# Patient Record
Sex: Female | Born: 1990 | Race: White | Hispanic: No | Smoking: Current every day smoker
Health system: Southern US, Community
[De-identification: ages and names within clinical notes are randomized; demographics above are authoritative.]

## PROBLEM LIST (undated history)

## (undated) ENCOUNTER — Inpatient Hospital Stay: Payer: Self-pay

## (undated) ENCOUNTER — Inpatient Hospital Stay (HOSPITAL_COMMUNITY): Payer: Self-pay

## (undated) DIAGNOSIS — L509 Urticaria, unspecified: Secondary | ICD-10-CM

## (undated) DIAGNOSIS — F329 Major depressive disorder, single episode, unspecified: Secondary | ICD-10-CM

## (undated) DIAGNOSIS — G4700 Insomnia, unspecified: Secondary | ICD-10-CM

## (undated) DIAGNOSIS — F32A Depression, unspecified: Secondary | ICD-10-CM

## (undated) DIAGNOSIS — F988 Other specified behavioral and emotional disorders with onset usually occurring in childhood and adolescence: Secondary | ICD-10-CM

## (undated) DIAGNOSIS — F1910 Other psychoactive substance abuse, uncomplicated: Secondary | ICD-10-CM

## (undated) DIAGNOSIS — T783XXA Angioneurotic edema, initial encounter: Secondary | ICD-10-CM

## (undated) DIAGNOSIS — N814 Uterovaginal prolapse, unspecified: Secondary | ICD-10-CM

## (undated) DIAGNOSIS — A549 Gonococcal infection, unspecified: Secondary | ICD-10-CM

## (undated) DIAGNOSIS — A749 Chlamydial infection, unspecified: Secondary | ICD-10-CM

## (undated) DIAGNOSIS — F419 Anxiety disorder, unspecified: Secondary | ICD-10-CM

## (undated) HISTORY — DX: Urticaria, unspecified: L50.9

## (undated) HISTORY — DX: Angioneurotic edema, initial encounter: T78.3XXA

## (undated) HISTORY — PX: WISDOM TOOTH EXTRACTION: SHX21

---
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
## 2009-02-03 ENCOUNTER — Inpatient Hospital Stay (HOSPITAL_COMMUNITY): Admission: AD | Admit: 2009-02-03 | Discharge: 2009-02-03 | Payer: Self-pay | Admitting: Obstetrics and Gynecology

## 2009-03-01 ENCOUNTER — Ambulatory Visit (HOSPITAL_COMMUNITY): Admission: RE | Admit: 2009-03-01 | Discharge: 2009-03-01 | Payer: Self-pay | Admitting: Obstetrics and Gynecology

## 2009-03-07 ENCOUNTER — Ambulatory Visit (HOSPITAL_COMMUNITY): Admission: RE | Admit: 2009-03-07 | Discharge: 2009-03-07 | Payer: Self-pay | Admitting: Obstetrics and Gynecology

## 2009-03-22 ENCOUNTER — Ambulatory Visit (HOSPITAL_COMMUNITY): Admission: RE | Admit: 2009-03-22 | Discharge: 2009-03-22 | Payer: Self-pay | Admitting: Obstetrics and Gynecology

## 2009-03-28 ENCOUNTER — Inpatient Hospital Stay (HOSPITAL_COMMUNITY): Admission: AD | Admit: 2009-03-28 | Discharge: 2009-03-28 | Payer: Self-pay | Admitting: Obstetrics & Gynecology

## 2009-04-02 ENCOUNTER — Emergency Department: Payer: Self-pay | Admitting: Unknown Physician Specialty

## 2009-04-19 ENCOUNTER — Ambulatory Visit (HOSPITAL_COMMUNITY): Admission: RE | Admit: 2009-04-19 | Discharge: 2009-04-19 | Payer: Self-pay | Admitting: Obstetrics and Gynecology

## 2009-06-14 ENCOUNTER — Ambulatory Visit (HOSPITAL_COMMUNITY): Admission: RE | Admit: 2009-06-14 | Discharge: 2009-06-14 | Payer: Self-pay | Admitting: Obstetrics and Gynecology

## 2009-08-13 ENCOUNTER — Inpatient Hospital Stay (HOSPITAL_COMMUNITY): Admission: AD | Admit: 2009-08-13 | Discharge: 2009-08-16 | Payer: Self-pay | Admitting: Obstetrics & Gynecology

## 2009-08-18 ENCOUNTER — Inpatient Hospital Stay (HOSPITAL_COMMUNITY): Admission: AD | Admit: 2009-08-18 | Discharge: 2009-08-18 | Payer: Self-pay | Admitting: Obstetrics & Gynecology

## 2010-01-24 ENCOUNTER — Emergency Department: Payer: Self-pay | Admitting: Emergency Medicine

## 2010-01-28 ENCOUNTER — Emergency Department: Payer: Self-pay | Admitting: Emergency Medicine

## 2010-05-24 ENCOUNTER — Emergency Department: Payer: Self-pay | Admitting: Emergency Medicine

## 2010-06-05 ENCOUNTER — Ambulatory Visit: Payer: Self-pay | Admitting: Nurse Practitioner

## 2010-06-05 ENCOUNTER — Inpatient Hospital Stay (HOSPITAL_COMMUNITY): Admission: AD | Admit: 2010-06-05 | Discharge: 2010-06-05 | Payer: Self-pay | Admitting: Obstetrics and Gynecology

## 2010-08-09 ENCOUNTER — Inpatient Hospital Stay (HOSPITAL_COMMUNITY)
Admission: AD | Admit: 2010-08-09 | Discharge: 2010-08-09 | Payer: Self-pay | Source: Home / Self Care | Attending: Family Medicine | Admitting: Family Medicine

## 2010-08-19 LAB — DIFFERENTIAL
Basophils Absolute: 0 10*3/uL (ref 0.0–0.1)
Basophils Relative: 0 % (ref 0–1)
Eosinophils Absolute: 0.2 10*3/uL (ref 0.0–0.7)
Eosinophils Relative: 3 % (ref 0–5)
Lymphocytes Relative: 22 % (ref 12–46)
Lymphs Abs: 1.3 10*3/uL (ref 0.7–4.0)
Monocytes Absolute: 0.5 10*3/uL (ref 0.1–1.0)
Monocytes Relative: 7 % (ref 3–12)
Neutro Abs: 4.2 10*3/uL (ref 1.7–7.7)
Neutrophils Relative %: 68 % (ref 43–77)

## 2010-08-19 LAB — URINALYSIS, ROUTINE W REFLEX MICROSCOPIC
Bilirubin Urine: NEGATIVE
Hgb urine dipstick: NEGATIVE
Ketones, ur: NEGATIVE mg/dL
Nitrite: NEGATIVE
Protein, ur: NEGATIVE mg/dL
Specific Gravity, Urine: 1.02 (ref 1.005–1.030)
Urine Glucose, Fasting: NEGATIVE mg/dL
Urobilinogen, UA: 0.2 mg/dL (ref 0.0–1.0)
pH: 6 (ref 5.0–8.0)

## 2010-08-19 LAB — CBC
HCT: 33.1 % — ABNORMAL LOW (ref 36.0–46.0)
Hemoglobin: 11.4 g/dL — ABNORMAL LOW (ref 12.0–15.0)
MCH: 31.8 pg (ref 26.0–34.0)
MCHC: 34.4 g/dL (ref 30.0–36.0)
MCV: 92.2 fL (ref 78.0–100.0)
Platelets: 169 10*3/uL (ref 150–400)
RBC: 3.59 MIL/uL — ABNORMAL LOW (ref 3.87–5.11)
RDW: 12.9 % (ref 11.5–15.5)
WBC: 6.2 10*3/uL (ref 4.0–10.5)

## 2010-08-19 LAB — WET PREP, GENITAL
Clue Cells Wet Prep HPF POC: NONE SEEN
Trich, Wet Prep: NONE SEEN
Yeast Wet Prep HPF POC: NONE SEEN

## 2010-08-19 LAB — GC/CHLAMYDIA PROBE AMP, GENITAL
Chlamydia, DNA Probe: NEGATIVE
GC Probe Amp, Genital: NEGATIVE

## 2010-09-23 ENCOUNTER — Inpatient Hospital Stay (HOSPITAL_COMMUNITY)
Admission: AD | Admit: 2010-09-23 | Discharge: 2010-09-23 | Disposition: A | Discharge status: Home / Self Care | Payer: Medicaid Other | Source: Ambulatory Visit | Attending: Obstetrics & Gynecology | Admitting: Obstetrics & Gynecology

## 2010-09-23 ENCOUNTER — Encounter (HOSPITAL_COMMUNITY): Payer: Self-pay | Admitting: Radiology

## 2010-09-23 ENCOUNTER — Inpatient Hospital Stay (HOSPITAL_COMMUNITY): Payer: Medicaid Other

## 2010-09-23 DIAGNOSIS — N390 Urinary tract infection, site not specified: Secondary | ICD-10-CM | POA: Insufficient documentation

## 2010-09-23 DIAGNOSIS — O209 Hemorrhage in early pregnancy, unspecified: Secondary | ICD-10-CM | POA: Insufficient documentation

## 2010-09-23 DIAGNOSIS — O2390 Unspecified genitourinary tract infection in pregnancy, unspecified trimester: Secondary | ICD-10-CM | POA: Insufficient documentation

## 2010-09-23 LAB — URINE MICROSCOPIC-ADD ON

## 2010-09-23 LAB — URINALYSIS, ROUTINE W REFLEX MICROSCOPIC
Bilirubin Urine: NEGATIVE
Ketones, ur: NEGATIVE mg/dL
Nitrite: NEGATIVE
Protein, ur: NEGATIVE mg/dL
Specific Gravity, Urine: >1.03 — ABNORMAL HIGH (ref 1.005–1.030)
Urine Glucose, Fasting: NEGATIVE mg/dL
Urobilinogen, UA: 0.2 mg/dL (ref 0.0–1.0)
pH: 6 (ref 5.0–8.0)

## 2010-09-23 LAB — WET PREP, GENITAL
Clue Cells Wet Prep HPF POC: NONE SEEN
Trich, Wet Prep: NONE SEEN
Yeast Wet Prep HPF POC: NONE SEEN

## 2010-09-24 LAB — GC/CHLAMYDIA PROBE AMP, GENITAL
Chlamydia, DNA Probe: NEGATIVE
GC Probe Amp, Genital: NEGATIVE

## 2010-09-25 LAB — HERPES SIMPLEX VIRUS CULTURE: Culture: NOT DETECTED

## 2010-09-25 LAB — URINE CULTURE
Colony Count: 25000
Culture  Setup Time: 201202210151

## 2010-10-15 LAB — URINALYSIS, ROUTINE W REFLEX MICROSCOPIC
Bilirubin Urine: NEGATIVE
Glucose, UA: NEGATIVE mg/dL
Ketones, ur: NEGATIVE mg/dL
Nitrite: NEGATIVE
Protein, ur: NEGATIVE mg/dL
Specific Gravity, Urine: 1.02 (ref 1.005–1.030)
Urobilinogen, UA: 0.2 mg/dL (ref 0.0–1.0)
pH: 7 (ref 5.0–8.0)

## 2010-10-15 LAB — URINE MICROSCOPIC-ADD ON

## 2010-10-15 LAB — POCT PREGNANCY, URINE: Preg Test, Ur: POSITIVE

## 2010-10-15 LAB — CBC
HCT: 37.8 % (ref 36.0–46.0)
Hemoglobin: 12.9 g/dL (ref 12.0–15.0)
MCH: 32 pg (ref 26.0–34.0)
MCV: 93.7 fL (ref 78.0–100.0)
Platelets: 203 10*3/uL (ref 150–400)
RBC: 4.04 MIL/uL (ref 3.87–5.11)
WBC: 6.8 10*3/uL (ref 4.0–10.5)

## 2010-10-15 LAB — WET PREP, GENITAL: Trich, Wet Prep: NONE SEEN

## 2010-10-15 LAB — GC/CHLAMYDIA PROBE AMP, GENITAL: GC Probe Amp, Genital: NEGATIVE

## 2010-10-20 LAB — RH IMMUNE GLOB WKUP(>/=20WKS)(NOT WOMEN'S HOSP)

## 2010-10-20 LAB — CBC
HCT: 21.6 % — ABNORMAL LOW (ref 36.0–46.0)
MCHC: 33.5 g/dL (ref 30.0–36.0)
Platelets: 141 10*3/uL — ABNORMAL LOW (ref 150–400)
RBC: 2.27 MIL/uL — ABNORMAL LOW (ref 3.87–5.11)
RBC: 3.44 MIL/uL — ABNORMAL LOW (ref 3.87–5.11)
WBC: 9.3 10*3/uL (ref 4.0–10.5)
WBC: 9.8 10*3/uL (ref 4.0–10.5)

## 2010-10-20 LAB — RPR: RPR Ser Ql: NONREACTIVE

## 2010-10-20 LAB — URINALYSIS, ROUTINE W REFLEX MICROSCOPIC
Glucose, UA: NEGATIVE mg/dL
Ketones, ur: NEGATIVE mg/dL
Protein, ur: 30 mg/dL — AB

## 2010-10-20 LAB — URINE MICROSCOPIC-ADD ON

## 2010-11-09 ENCOUNTER — Inpatient Hospital Stay (HOSPITAL_COMMUNITY)
Admission: AD | Admit: 2010-11-09 | Discharge: 2010-11-09 | Disposition: A | Discharge status: Home / Self Care | Payer: 59 | Source: Ambulatory Visit | Attending: Obstetrics & Gynecology | Admitting: Obstetrics & Gynecology

## 2010-11-09 ENCOUNTER — Inpatient Hospital Stay (HOSPITAL_COMMUNITY): Payer: 59

## 2010-11-09 DIAGNOSIS — O99891 Other specified diseases and conditions complicating pregnancy: Secondary | ICD-10-CM | POA: Insufficient documentation

## 2010-11-09 DIAGNOSIS — O9989 Other specified diseases and conditions complicating pregnancy, childbirth and the puerperium: Secondary | ICD-10-CM

## 2010-11-09 DIAGNOSIS — R109 Unspecified abdominal pain: Secondary | ICD-10-CM

## 2010-11-09 LAB — COMPREHENSIVE METABOLIC PANEL
ALT: 12 U/L (ref 0–35)
Alkaline Phosphatase: 49 U/L (ref 39–117)
Alkaline Phosphatase: 72 U/L (ref 39–117)
BUN: 4 mg/dL — ABNORMAL LOW (ref 6–23)
CO2: 25 mEq/L (ref 19–32)
Calcium: 8.7 mg/dL (ref 8.4–10.5)
Chloride: 107 mEq/L (ref 96–112)
Chloride: 105 mEq/L (ref 96–112)
GFR calc non Af Amer: >60 mL/min (ref >60)
Glucose, Bld: 75 mg/dL (ref 70–99)
Glucose, Bld: 88 mg/dL (ref 70–99)
Potassium: 4.6 mEq/L (ref 3.5–5.1)
Sodium: 137 mEq/L (ref 135–145)
Total Bilirubin: 0.4 mg/dL (ref 0.3–1.2)
Total Bilirubin: 0.4 mg/dL (ref 0.3–1.2)

## 2010-11-09 LAB — CBC
HCT: 33.9 % — ABNORMAL LOW (ref 36.0–46.0)
HCT: 32.7 % — ABNORMAL LOW (ref 36.0–46.0)
Hemoglobin: 11.8 g/dL — ABNORMAL LOW (ref 12.0–15.0)
Hemoglobin: 11 g/dL — ABNORMAL LOW (ref 12.0–15.0)
MCHC: 33.6 g/dL (ref 30.0–36.0)
WBC: 10.5 10*3/uL (ref 4.0–10.5)

## 2010-11-09 LAB — URINE MICROSCOPIC-ADD ON

## 2010-11-09 LAB — URINALYSIS, ROUTINE W REFLEX MICROSCOPIC
Protein, ur: NEGATIVE mg/dL
Urobilinogen, UA: 1 mg/dL (ref 0.0–1.0)

## 2010-11-09 LAB — AMYLASE: Amylase: 34 U/L (ref 0–105)

## 2010-11-10 LAB — URINE CULTURE

## 2010-11-11 LAB — URINE CULTURE: Colony Count: 75000

## 2010-11-11 LAB — CBC
HCT: 37.9 % (ref 36.0–46.0)
Hemoglobin: 13.4 g/dL (ref 12.0–15.0)
MCV: 98 fL (ref 78.0–100.0)
Platelets: 181 10*3/uL (ref 150–400)
RDW: 12.4 % (ref 11.5–15.5)

## 2010-11-11 LAB — URINALYSIS, ROUTINE W REFLEX MICROSCOPIC
Bilirubin Urine: NEGATIVE
Glucose, UA: NEGATIVE mg/dL
Hgb urine dipstick: NEGATIVE
Ketones, ur: NEGATIVE mg/dL
Protein, ur: NEGATIVE mg/dL
Urobilinogen, UA: 0.2 mg/dL (ref 0.0–1.0)

## 2010-11-11 LAB — WET PREP, GENITAL

## 2010-11-11 LAB — URINE MICROSCOPIC-ADD ON

## 2010-12-12 ENCOUNTER — Inpatient Hospital Stay (HOSPITAL_COMMUNITY)
Admission: AD | Admit: 2010-12-12 | Discharge: 2010-12-12 | Disposition: A | Discharge status: Home / Self Care | Payer: Medicaid Other | Source: Ambulatory Visit | Attending: Obstetrics & Gynecology | Admitting: Obstetrics & Gynecology

## 2010-12-12 DIAGNOSIS — N390 Urinary tract infection, site not specified: Secondary | ICD-10-CM | POA: Insufficient documentation

## 2010-12-12 DIAGNOSIS — O2390 Unspecified genitourinary tract infection in pregnancy, unspecified trimester: Secondary | ICD-10-CM | POA: Insufficient documentation

## 2010-12-12 DIAGNOSIS — O368190 Decreased fetal movements, unspecified trimester, not applicable or unspecified: Secondary | ICD-10-CM | POA: Insufficient documentation

## 2010-12-12 LAB — URINE MICROSCOPIC-ADD ON

## 2010-12-12 LAB — URINALYSIS, ROUTINE W REFLEX MICROSCOPIC
Bilirubin Urine: NEGATIVE
Hgb urine dipstick: NEGATIVE
Ketones, ur: 15 mg/dL — AB
Nitrite: NEGATIVE
Protein, ur: NEGATIVE mg/dL
Specific Gravity, Urine: 1.02 (ref 1.005–1.030)
Urobilinogen, UA: 0.2 mg/dL (ref 0.0–1.0)

## 2010-12-12 LAB — DIFFERENTIAL
Basophils Absolute: 0 10*3/uL (ref 0.0–0.1)
Eosinophils Absolute: 0.1 10*3/uL (ref 0.0–0.7)
Eosinophils Relative: 1 % (ref 0–5)
Lymphs Abs: 1.4 10*3/uL (ref 0.7–4.0)
Monocytes Absolute: 0.5 10*3/uL (ref 0.1–1.0)

## 2010-12-12 LAB — CBC
MCHC: 32.7 g/dL (ref 30.0–36.0)
MCV: 93.3 fL (ref 78.0–100.0)
Platelets: 163 10*3/uL (ref 150–400)
RDW: 12.3 % (ref 11.5–15.5)
WBC: 8.2 10*3/uL (ref 4.0–10.5)

## 2010-12-13 LAB — URINE CULTURE
Colony Count: 10000
Culture  Setup Time: 201205101034

## 2010-12-20 ENCOUNTER — Inpatient Hospital Stay (HOSPITAL_COMMUNITY)
Admission: AD | Admit: 2010-12-20 | Discharge: 2010-12-23 | DRG: 774 | Disposition: A | Discharge status: Home / Self Care | Payer: 59 | Source: Ambulatory Visit | Attending: Obstetrics & Gynecology | Admitting: Obstetrics & Gynecology

## 2010-12-20 DIAGNOSIS — A630 Anogenital (venereal) warts: Secondary | ICD-10-CM | POA: Diagnosis present

## 2010-12-20 DIAGNOSIS — O98519 Other viral diseases complicating pregnancy, unspecified trimester: Secondary | ICD-10-CM | POA: Diagnosis present

## 2010-12-20 LAB — CBC
MCH: 30 pg (ref 26.0–34.0)
MCHC: 32.5 g/dL (ref 30.0–36.0)
Platelets: 170 10*3/uL (ref 150–400)
RDW: 12.8 % (ref 11.5–15.5)
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
## 2010-12-21 LAB — RPR: RPR Ser Ql: NONREACTIVE

## 2010-12-22 LAB — CBC
HCT: 28.8 % — ABNORMAL LOW (ref 36.0–46.0)
MCHC: 31.9 g/dL (ref 30.0–36.0)
Platelets: 146 10*3/uL — ABNORMAL LOW (ref 150–400)
RDW: 13 % (ref 11.5–15.5)
WBC: 6.4 10*3/uL (ref 4.0–10.5)

## 2010-12-23 LAB — RH IMMUNE GLOB WKUP(>/=20WKS)(NOT WOMEN'S HOSP): Fetal Screen: NEGATIVE

## 2011-02-26 ENCOUNTER — Emergency Department: Payer: Self-pay | Admitting: Emergency Medicine

## 2011-09-03 ENCOUNTER — Emergency Department: Payer: Self-pay | Admitting: Emergency Medicine

## 2012-09-08 ENCOUNTER — Emergency Department: Payer: Self-pay | Admitting: Internal Medicine

## 2012-12-25 ENCOUNTER — Emergency Department: Payer: Self-pay | Admitting: Emergency Medicine

## 2013-05-08 ENCOUNTER — Emergency Department (HOSPITAL_COMMUNITY): Payer: No Typology Code available for payment source

## 2013-05-08 ENCOUNTER — Emergency Department (HOSPITAL_COMMUNITY)
Admission: EM | Admit: 2013-05-08 | Discharge: 2013-05-08 | Disposition: A | Discharge status: Home / Self Care | Payer: No Typology Code available for payment source | Attending: Emergency Medicine | Admitting: Emergency Medicine

## 2013-05-08 ENCOUNTER — Encounter (HOSPITAL_COMMUNITY): Payer: Self-pay | Admitting: Emergency Medicine

## 2013-05-08 DIAGNOSIS — S3981XA Other specified injuries of abdomen, initial encounter: Secondary | ICD-10-CM | POA: Insufficient documentation

## 2013-05-08 DIAGNOSIS — Y9389 Activity, other specified: Secondary | ICD-10-CM | POA: Insufficient documentation

## 2013-05-08 DIAGNOSIS — R040 Epistaxis: Secondary | ICD-10-CM | POA: Insufficient documentation

## 2013-05-08 DIAGNOSIS — Y92410 Unspecified street and highway as the place of occurrence of the external cause: Secondary | ICD-10-CM | POA: Insufficient documentation

## 2013-05-08 DIAGNOSIS — S39012A Strain of muscle, fascia and tendon of lower back, initial encounter: Secondary | ICD-10-CM

## 2013-05-08 DIAGNOSIS — Z79899 Other long term (current) drug therapy: Secondary | ICD-10-CM | POA: Insufficient documentation

## 2013-05-08 DIAGNOSIS — F17200 Nicotine dependence, unspecified, uncomplicated: Secondary | ICD-10-CM | POA: Insufficient documentation

## 2013-05-08 DIAGNOSIS — IMO0002 Reserved for concepts with insufficient information to code with codable children: Secondary | ICD-10-CM | POA: Insufficient documentation

## 2013-05-08 DIAGNOSIS — S0003XA Contusion of scalp, initial encounter: Secondary | ICD-10-CM | POA: Insufficient documentation

## 2013-05-08 DIAGNOSIS — S0083XA Contusion of other part of head, initial encounter: Secondary | ICD-10-CM

## 2013-05-08 LAB — COMPREHENSIVE METABOLIC PANEL
Alkaline Phosphatase: 53 U/L (ref 39–117)
BUN: 9 mg/dL (ref 6–23)
Chloride: 107 mEq/L (ref 96–112)
Creatinine, Ser: 0.76 mg/dL (ref 0.50–1.10)
GFR calc Af Amer: >90 mL/min (ref >90)
GFR calc non Af Amer: >90 mL/min (ref >90)
Glucose, Bld: 95 mg/dL (ref 70–99)
Potassium: 3.4 mEq/L — ABNORMAL LOW (ref 3.5–5.1)
Total Bilirubin: 0.2 mg/dL — ABNORMAL LOW (ref 0.3–1.2)

## 2013-05-08 LAB — CBC WITH DIFFERENTIAL/PLATELET
Basophils Relative: 0 % (ref 0–1)
Eosinophils Absolute: 0.1 10*3/uL (ref 0.0–0.7)
HCT: 38.7 % (ref 36.0–46.0)
Hemoglobin: 13.6 g/dL (ref 12.0–15.0)
Lymphs Abs: 1.1 10*3/uL (ref 0.7–4.0)
MCH: 33.2 pg (ref 26.0–34.0)
MCHC: 35.1 g/dL (ref 30.0–36.0)
Monocytes Absolute: 0.7 10*3/uL (ref 0.1–1.0)
Monocytes Relative: 12 % (ref 3–12)
Neutro Abs: 3.4 10*3/uL (ref 1.7–7.7)
Neutrophils Relative %: 64 % (ref 43–77)
RBC: 4.1 MIL/uL (ref 3.87–5.11)

## 2013-05-08 LAB — ETHANOL: Alcohol, Ethyl (B): <11 mg/dL (ref 0–11)

## 2013-05-08 LAB — HCG, SERUM, QUALITATIVE: Preg, Serum: NEGATIVE

## 2013-05-08 LAB — ABO/RH: ABO/RH(D): A NEG

## 2013-05-08 MED ORDER — OXYCODONE-ACETAMINOPHEN 5-325 MG PO TABS
1.0000 | ORAL_TABLET | Freq: Once | ORAL | Status: AC
  Administered 2013-05-08: 1 via ORAL
  Filled 2013-05-08: qty 1

## 2013-05-08 MED ORDER — IOHEXOL 300 MG/ML  SOLN
100.0000 mL | Freq: Once | INTRAMUSCULAR | Status: AC | PRN
  Administered 2013-05-08: 100 mL via INTRAVENOUS

## 2013-05-08 MED ORDER — SODIUM CHLORIDE 0.9 % IV BOLUS (SEPSIS)
1000.0000 mL | Freq: Once | INTRAVENOUS | Status: AC
  Administered 2013-05-08: 1000 mL via INTRAVENOUS

## 2013-05-08 MED ORDER — OXYCODONE-ACETAMINOPHEN 5-325 MG PO TABS: 1.0000 | ORAL_TABLET | ORAL | Status: DC | PRN

## 2013-05-08 NOTE — ED Notes (Signed)
EDP at bedside, reports to let pt rest a little longer. Pt still very drowsy. Spontaneous movement.
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
## 2013-05-08 NOTE — ED Notes (Signed)
Spoke with pt's grandmother. Grandmother given number to call when finds a ride for pt. Pt ambulated independently to bathroom. Breakfast tray ordered.

## 2013-05-08 NOTE — ED Notes (Signed)
Pt states she took one xanax tonight about two hours ago.

## 2013-05-08 NOTE — ED Notes (Signed)
Went in to introduce self to pt, pt very lethargic. Hard to arouse. Pt eventually responded with continuous stimulation. Pt currently back sleeping. VS stable.
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
## 2013-05-08 NOTE — ED Notes (Signed)
Pt sitting at bedside eating breakfast. Waiting for ride home. Pt cleaned up with washcloth.
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
## 2013-05-08 NOTE — ED Notes (Signed)
Portable XR in room. 

## 2013-05-08 NOTE — ED Notes (Signed)
Pt more awake. Able to talk and answer questions. VS stable.
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
## 2013-08-01 ENCOUNTER — Other Ambulatory Visit: Payer: Self-pay | Admitting: Internal Medicine

## 2013-08-01 DIAGNOSIS — R109 Unspecified abdominal pain: Secondary | ICD-10-CM

## 2013-08-03 ENCOUNTER — Ambulatory Visit
Admission: RE | Admit: 2013-08-03 | Discharge: 2013-08-03 | Disposition: A | Discharge status: Home / Self Care | Payer: Managed Care, Other (non HMO) | Source: Ambulatory Visit | Attending: Internal Medicine | Admitting: Internal Medicine

## 2013-08-03 DIAGNOSIS — R109 Unspecified abdominal pain: Secondary | ICD-10-CM

## 2013-12-22 ENCOUNTER — Encounter (HOSPITAL_COMMUNITY): Payer: Self-pay | Admitting: Emergency Medicine

## 2013-12-22 ENCOUNTER — Emergency Department (HOSPITAL_COMMUNITY)
Admission: EM | Admit: 2013-12-22 | Discharge: 2013-12-23 | Disposition: A | Discharge status: Home / Self Care | Payer: Managed Care, Other (non HMO) | Attending: Emergency Medicine | Admitting: Emergency Medicine

## 2013-12-22 DIAGNOSIS — Z3202 Encounter for pregnancy test, result negative: Secondary | ICD-10-CM | POA: Diagnosis not present

## 2013-12-22 DIAGNOSIS — F3289 Other specified depressive episodes: Secondary | ICD-10-CM | POA: Diagnosis not present

## 2013-12-22 DIAGNOSIS — A5909 Other urogenital trichomoniasis: Secondary | ICD-10-CM | POA: Diagnosis not present

## 2013-12-22 DIAGNOSIS — Z79899 Other long term (current) drug therapy: Secondary | ICD-10-CM | POA: Insufficient documentation

## 2013-12-22 DIAGNOSIS — F17200 Nicotine dependence, unspecified, uncomplicated: Secondary | ICD-10-CM | POA: Diagnosis not present

## 2013-12-22 DIAGNOSIS — F411 Generalized anxiety disorder: Secondary | ICD-10-CM | POA: Insufficient documentation

## 2013-12-22 DIAGNOSIS — F329 Major depressive disorder, single episode, unspecified: Secondary | ICD-10-CM | POA: Insufficient documentation

## 2013-12-22 DIAGNOSIS — R21 Rash and other nonspecific skin eruption: Secondary | ICD-10-CM | POA: Diagnosis present

## 2013-12-22 HISTORY — DX: Anxiety disorder, unspecified: F41.9

## 2013-12-22 HISTORY — DX: Major depressive disorder, single episode, unspecified: F32.9

## 2013-12-22 HISTORY — DX: Depression, unspecified: F32.A

## 2013-12-22 MED ORDER — ONDANSETRON 4 MG PO TBDP
4.0000 mg | ORAL_TABLET | Freq: Once | ORAL | Status: DC
  Filled 2013-12-22: qty 1

## 2013-12-22 MED ORDER — IBUPROFEN 800 MG PO TABS
800.0000 mg | ORAL_TABLET | Freq: Once | ORAL | Status: AC
  Administered 2013-12-22: 800 mg via ORAL
  Filled 2013-12-22: qty 1

## 2013-12-22 NOTE — ED Notes (Signed)
Rash to buttocks and vag d/c

## 2013-12-23 DIAGNOSIS — A5909 Other urogenital trichomoniasis: Secondary | ICD-10-CM | POA: Diagnosis not present

## 2013-12-23 LAB — CBC WITH DIFFERENTIAL/PLATELET
BASOS PCT: 0 % (ref 0–1)
Basophils Absolute: 0 10*3/uL (ref 0.0–0.1)
EOS ABS: 0 10*3/uL (ref 0.0–0.7)
EOS PCT: 1 % (ref 0–5)
HCT: 39.1 % (ref 36.0–46.0)
HEMOGLOBIN: 13.4 g/dL (ref 12.0–15.0)
Lymphocytes Relative: 18 % (ref 12–46)
Lymphs Abs: 1.3 10*3/uL (ref 0.7–4.0)
MCH: 32.1 pg (ref 26.0–34.0)
MCHC: 34.3 g/dL (ref 30.0–36.0)
MCV: 93.5 fL (ref 78.0–100.0)
MONOS PCT: 7 % (ref 3–12)
Monocytes Absolute: 0.5 10*3/uL (ref 0.1–1.0)
NEUTROS PCT: 74 % (ref 43–77)
Neutro Abs: 5 10*3/uL (ref 1.7–7.7)
Platelets: 227 10*3/uL (ref 150–400)
RBC: 4.18 MIL/uL (ref 3.87–5.11)
RDW: 11.6 % (ref 11.5–15.5)
WBC: 6.8 10*3/uL (ref 4.0–10.5)

## 2013-12-23 LAB — WET PREP, GENITAL: Yeast Wet Prep HPF POC: NONE SEEN

## 2013-12-23 LAB — COMPREHENSIVE METABOLIC PANEL
ALBUMIN: 4.3 g/dL (ref 3.5–5.2)
ALK PHOS: 56 U/L (ref 39–117)
ALT: 9 U/L (ref 0–35)
AST: 16 U/L (ref 0–37)
BUN: 10 mg/dL (ref 6–23)
CALCIUM: 9.5 mg/dL (ref 8.4–10.5)
CO2: 26 mEq/L (ref 19–32)
CREATININE: 0.94 mg/dL (ref 0.50–1.10)
Chloride: 102 mEq/L (ref 96–112)
GFR calc non Af Amer: 84 mL/min — ABNORMAL LOW (ref >90)
Glucose, Bld: 76 mg/dL (ref 70–99)
POTASSIUM: 3.8 meq/L (ref 3.7–5.3)
Sodium: 140 mEq/L (ref 137–147)
TOTAL PROTEIN: 7.4 g/dL (ref 6.0–8.3)
Total Bilirubin: 0.7 mg/dL (ref 0.3–1.2)

## 2013-12-23 LAB — URINALYSIS, ROUTINE W REFLEX MICROSCOPIC
Glucose, UA: NEGATIVE mg/dL
KETONES UR: NEGATIVE mg/dL
NITRITE: NEGATIVE
PH: 5.5 (ref 5.0–8.0)
Protein, ur: 100 mg/dL — AB
Specific Gravity, Urine: >1.03 — ABNORMAL HIGH (ref 1.005–1.030)
Urobilinogen, UA: 0.2 mg/dL (ref 0.0–1.0)

## 2013-12-23 LAB — URINE MICROSCOPIC-ADD ON

## 2013-12-23 LAB — HIV ANTIBODY (ROUTINE TESTING W REFLEX): HIV 1&2 Ab, 4th Generation: NONREACTIVE

## 2013-12-23 LAB — RPR

## 2013-12-23 LAB — POC URINE PREG, ED: PREG TEST UR: NEGATIVE

## 2013-12-23 MED ORDER — LIDOCAINE HCL (PF) 1 % IJ SOLN
INTRAMUSCULAR | Status: AC
  Filled 2013-12-23: qty 5

## 2013-12-23 MED ORDER — OXYCODONE-ACETAMINOPHEN 5-325 MG PO TABS
1.0000 | ORAL_TABLET | Freq: Once | ORAL | Status: AC
  Administered 2013-12-23: 1 via ORAL
  Filled 2013-12-23: qty 1

## 2013-12-23 MED ORDER — CEFTRIAXONE SODIUM 250 MG IJ SOLR
250.0000 mg | Freq: Once | INTRAMUSCULAR | Status: AC
  Administered 2013-12-23: 250 mg via INTRAMUSCULAR
  Filled 2013-12-23: qty 250

## 2013-12-23 MED ORDER — METRONIDAZOLE 500 MG PO TABS: 500.0000 mg | ORAL_TABLET | Freq: Two times a day (BID) | ORAL | Status: DC

## 2013-12-23 MED ORDER — AZITHROMYCIN 250 MG PO TABS
1000.0000 mg | ORAL_TABLET | Freq: Once | ORAL | Status: AC
  Administered 2013-12-23: 1000 mg via ORAL
  Filled 2013-12-23: qty 4

## 2013-12-23 MED ORDER — METRONIDAZOLE 500 MG PO TABS
500.0000 mg | ORAL_TABLET | Freq: Once | ORAL | Status: AC
Start: 2013-12-23 — End: 2013-12-23
  Administered 2013-12-23: 500 mg via ORAL
  Filled 2013-12-23: qty 1

## 2013-12-23 NOTE — Discharge Instructions (Signed)
Trichomoniasis Trichomoniasis is an infection, caused by the Trichomonas organism, that affects both women and men. In women, the outer female genitalia and the vagina are affected. In men, the penis is mainly affected, but the prostate and other reproductive organs can also be involved. Trichomoniasis is a sexually transmitted disease (STD) and is most often passed to another person through sexual contact. The majority of people who get trichomoniasis do so from a sexual encounter and are also at risk for other STDs. CAUSES   Sexual intercourse with an infected partner.  It can be present in swimming pools or hot tubs. SYMPTOMS   Abnormal gray-green frothy vaginal discharge in women.  Vaginal itching and irritation in women.  Itching and irritation of the area outside the vagina in women.  Penile discharge with or without pain in males.  Inflammation of the urethra (urethritis), causing painful urination.  Bleeding after sexual intercourse. RELATED COMPLICATIONS  Pelvic inflammatory disease.  Infection of the uterus (endometritis).  Infertility.  Tubal (ectopic) pregnancy.  It can be associated with other STDs, including gonorrhea and chlamydia, hepatitis B, and HIV. COMPLICATIONS DURING PREGNANCY  Early (premature) delivery.  Premature rupture of the membranes (PROM).  Low birth weight. DIAGNOSIS   Visualization of Trichomonas under the microscope from the vagina discharge.  Ph of the vagina greater than 4.5, tested with a test tape.  Trich Rapid Test.  Culture of the organism, but this is not usually needed.  It may be found on a Pap test.  Having a "strawberry cervix,"which means the cervix looks very red like a strawberry. TREATMENT   You may be given medication to fight the infection. Inform your caregiver if you could be or are pregnant. Some medications used to treat the infection should not be taken during pregnancy.  Over-the-counter medications or  creams to decrease itching or irritation may be recommended.  Your sexual partner will need to be treated if infected. HOME CARE INSTRUCTIONS   Take all medication prescribed by your caregiver.  Take over-the-counter medication for itching or irritation as directed by your caregiver.  Do not have sexual intercourse while you have the infection.  Do not douche or wear tampons.  Discuss your infection with your partner, as your partner may have acquired the infection from you. Or, your partner may have been the person who transmitted the infection to you.  Have your sex partner examined and treated if necessary.  Practice safe, informed, and protected sex.  See your caregiver for other STD testing. SEEK MEDICAL CARE IF:   You still have symptoms after you finish the medication.  You have an oral temperature above 102 F (38.9 C).  You develop belly (abdominal) pain.  You have pain when you urinate.  You have bleeding after sexual intercourse.  You develop a rash.  The medication makes you sick or makes you throw up (vomit). Document Released: 01/14/2001 Document Revised: 10/13/2011 Document Reviewed: 02/09/2009 [facility] Patient Information 2014 [place], [place].  Sexually Transmitted Disease A sexually transmitted disease (STD) is a disease or infection. It may be passed from person to person. It usually is passed during sex. STDs can be spread by different types of germs. These germs are bacteria, viruses, and parasites. An STD can be passed through:  Spit (saliva).  Semen.  Blood.  Mucus from the vagina.  Pee (urine). HOW CAN I LESSEN MY CHANCES OF GETTING AN STD?  Only use condoms labeled "latex," dental dams, and lubricants that wash away with water (water  soluble). Do not use petroleum jelly or oils.  Get shots (vaccines) for HPV and hepatitis.  Avoid risky sex behavior that can break the skin. WHAT SHOULD I DO IF I THINK I HAVE AN STD?  See your  doctor.  Tell your sex partner(s) that you have an STD. They should be tested and treated.  Do not have sex until your doctor says it is OK. WHEN SHOULD I GET HELP? Get help if:  You have bad belly (abdominal) pain.  You are a man and have puffiness (swelling) or pain in your testicles.  You are a woman and have puffiness in your vagina. MAKE SURE YOU:  Understand these instructions. Document Released: 08/28/2004 Document Revised: 05/11/2013 Document Reviewed: 01/14/2013 [facility] Patient Information 2014 [place].

## 2013-12-24 LAB — GC/CHLAMYDIA PROBE AMP
CT PROBE, AMP APTIMA: NEGATIVE
GC Probe RNA: NEGATIVE

## 2013-12-26 NOTE — ED Provider Notes (Signed)
CSN: [id number]     Arrival date & time 12/22/13  2145 History   First MD Initiated Contact with Patient 12/22/13 2324     Chief Complaint  Patient presents with  . Rash     (Consider location/radiation/quality/duration/timing/severity/associated sxs/prior Treatment) Patient is a 23 y.o. female presenting with vaginal discharge. The history is provided by the patient.  Vaginal Discharge Quality:  Thin, white and gray Severity:  Moderate Onset quality:  Gradual Duration:  2 weeks Timing:  Constant Progression:  Worsening Chronicity:  Chronic Context: not recent antibiotic use   Relieved by:  Nothing Worsened by:  Nothing tried Ineffective treatments: OTC vaginal yeast cream. Associated symptoms: rash and vaginal itching   Associated symptoms: no abdominal pain, no dyspareunia, no dysuria, no fever, no genital lesions, no nausea, no urinary frequency, no urinary hesitancy, no urinary incontinence and no vomiting   Associated symptoms comment:  Rash to gluteal fold Risk factors: unprotected sex   Risk factors: no gynecological surgery, no new sexual partner and no PID   Risk factors comment:  Hx of previous chlaymdia infection   Past Medical History  Diagnosis Date  . Anxiety   . Depression    History reviewed. No pertinent past surgical history. History reviewed. No pertinent family history. History  Substance Use Topics  . Smoking status: Current Every Day Smoker    Types: Cigarettes  . Smokeless tobacco: Not on file  . Alcohol Use: No   OB History   Grav Para Term Preterm Abortions TAB SAB Ect Mult Living   1              Review of Systems  Constitutional: Negative for fever, chills and appetite change.  Respiratory: Negative for shortness of breath.   Cardiovascular: Negative for chest pain.  Gastrointestinal: Negative for nausea, vomiting, abdominal pain and blood in stool.  Genitourinary: Positive for vaginal discharge. Negative for bladder incontinence,  dysuria, hesitancy, frequency, hematuria, flank pain, decreased urine volume, vaginal bleeding, difficulty urinating, genital sores, vaginal pain, pelvic pain and dyspareunia.  Musculoskeletal: Negative for back pain.  Skin: Positive for rash. Negative for color change.       Between buttocks  Neurological: Negative for dizziness, weakness and numbness.  Hematological: Negative for adenopathy.  All other systems reviewed and are negative.     Allergies  Review of patient's allergies indicates no known allergies.  Home Medications   Prior to Admission medications   Medication Sig Start Date End Date Taking? Authorizing Provider  ALPRAZolam Prudy Feeler(XANAX) 1 MG tablet Take 1 mg by mouth daily as needed for sleep.    Historical Provider, MD  gabapentin (NEURONTIN) 400 MG capsule Take 400 mg by mouth 2 (two) times daily.    Historical Provider, MD  metroNIDAZOLE (FLAGYL) 500 MG tablet Take 1 tablet (500 mg total) by mouth 2 (two) times daily. For 7 days 12/23/13   Marillyn Goren L. Graves Nipp, PA-C  oxyCODONE-acetaminophen (PERCOCET) 5-325 MG per tablet Take 1 tablet by mouth every 4 (four) hours as needed for pain. 05/08/13   Junius ArgyleForrest S Harrison, MD   BP 116/79  Pulse 103  Temp(Src) 98 F (36.7 C) (Oral)  Resp 20  Ht 5\' 10"  (1.778 m)  Wt 154 lb (69.854 kg)  BMI 22.10 kg/m2  SpO2 98%  LMP 12/22/2013  Breastfeeding? No Physical Exam  Nursing note and vitals reviewed. Constitutional: She is oriented to person, place, and time. She appears well-developed and well-nourished. No distress.  HENT:  Head: Normocephalic and atraumatic.  Mouth/Throat: Oropharynx is clear and moist.  Cardiovascular: Normal rate, regular rhythm, normal heart sounds and intact distal pulses.   No murmur heard. Pulmonary/Chest: Effort normal and breath sounds normal. No respiratory distress.  Abdominal: Soft. Normal appearance and bowel sounds are normal. She exhibits no distension and no mass. There is no hepatosplenomegaly.  There is no tenderness. There is no rebound, no guarding and no CVA tenderness.  Genitourinary: There is no rash, tenderness, lesion or injury on the right labia. There is no rash, tenderness, lesion or injury on the left labia. Uterus is not enlarged and not tender. Cervix exhibits no motion tenderness, no discharge and no friability. Right adnexum displays no mass, no tenderness and no fullness. Left adnexum displays no mass, no tenderness and no fullness. No erythema, tenderness or bleeding around the vagina. No foreign body around the vagina. No signs of injury around the vagina. Vaginal discharge found.  Thin white discharge to the vaginal vault.  No CMT.  No adnexal masses or tenderness.  No FB  Musculoskeletal: Normal range of motion. She exhibits no edema.  Lymphadenopathy:       Right: No inguinal adenopathy present.       Left: No inguinal adenopathy present.  Neurological: She is alert and oriented to person, place, and time. She exhibits normal muscle tone. Coordination normal.  Skin: Skin is warm and dry.    ED Course  Procedures (including critical care time) Labs Review Labs Reviewed  WET PREP, GENITAL - Abnormal; Notable for the following:    Trich, Wet Prep FEW (*)    Clue Cells Wet Prep HPF POC FEW (*)    WBC, Wet Prep HPF POC TOO NUMEROUS TO COUNT (*)    All other components within normal limits  URINALYSIS, ROUTINE W REFLEX MICROSCOPIC - Abnormal; Notable for the following:    APPearance HAZY (*)    Specific Gravity, Urine >1.030 (*)    Hgb urine dipstick SMALL (*)    Bilirubin Urine SMALL (*)    Protein, ur 100 (*)    Leukocytes, UA SMALL (*)    All other components within normal limits  COMPREHENSIVE METABOLIC PANEL - Abnormal; Notable for the following:    GFR calc non Af Amer 84 (*)    All other components within normal limits  URINE MICROSCOPIC-ADD ON - Abnormal; Notable for the following:    Squamous Epithelial / LPF MANY (*)    Bacteria, UA MANY (*)    All  other components within normal limits  GC/CHLAMYDIA PROBE AMP  CBC WITH DIFFERENTIAL  RPR  HIV ANTIBODY (ROUTINE TESTING)  POC URINE PREG, ED    Imaging Review No results found.   EKG Interpretation None      GC , chlamydia , RPR, HIV pending.    MDM   Final diagnoses:  Trichomonal cervicitis   Pt advised of lab results and importance that all sexual partners be tested.  Will treat with flagyl, rochepin and zithromax.  Pt is well appearing, ambulates with steady gait.  No concerning sx's for TOA or PID.  Pt agrees to close f/u with health dept.  VSS.  Pt appears stable for d/c and agrees to plan.       Maile Linford L. Trisha Mangle, PA-C 12/26/13 2233

## 2014-01-04 NOTE — ED Provider Notes (Signed)
Medical screening examination/treatment/procedure(s) were performed by non-physician practitioner and as supervising physician I was immediately available for consultation/collaboration.   Dione Booze, MD 01/04/14 [phone]

## 2014-04-16 ENCOUNTER — Emergency Department (HOSPITAL_COMMUNITY)
Admission: EM | Admit: 2014-04-16 | Discharge: 2014-04-16 | Disposition: A | Discharge status: Home / Self Care | Payer: Managed Care, Other (non HMO) | Attending: Emergency Medicine | Admitting: Emergency Medicine

## 2014-04-16 ENCOUNTER — Emergency Department (HOSPITAL_COMMUNITY): Payer: Managed Care, Other (non HMO)

## 2014-04-16 ENCOUNTER — Encounter (HOSPITAL_COMMUNITY): Payer: Self-pay | Admitting: Emergency Medicine

## 2014-04-16 DIAGNOSIS — F17200 Nicotine dependence, unspecified, uncomplicated: Secondary | ICD-10-CM | POA: Insufficient documentation

## 2014-04-16 DIAGNOSIS — S6990XA Unspecified injury of unspecified wrist, hand and finger(s), initial encounter: Secondary | ICD-10-CM | POA: Diagnosis present

## 2014-04-16 DIAGNOSIS — F3289 Other specified depressive episodes: Secondary | ICD-10-CM | POA: Diagnosis not present

## 2014-04-16 DIAGNOSIS — Y9389 Activity, other specified: Secondary | ICD-10-CM | POA: Diagnosis not present

## 2014-04-16 DIAGNOSIS — S60229A Contusion of unspecified hand, initial encounter: Secondary | ICD-10-CM | POA: Diagnosis not present

## 2014-04-16 DIAGNOSIS — F410 Panic disorder [episodic paroxysmal anxiety] without agoraphobia: Secondary | ICD-10-CM | POA: Diagnosis not present

## 2014-04-16 DIAGNOSIS — Z765 Malingerer [conscious simulation]: Secondary | ICD-10-CM | POA: Insufficient documentation

## 2014-04-16 DIAGNOSIS — F329 Major depressive disorder, single episode, unspecified: Secondary | ICD-10-CM | POA: Insufficient documentation

## 2014-04-16 DIAGNOSIS — Y9289 Other specified places as the place of occurrence of the external cause: Secondary | ICD-10-CM | POA: Insufficient documentation

## 2014-04-16 DIAGNOSIS — W230XXA Caught, crushed, jammed, or pinched between moving objects, initial encounter: Secondary | ICD-10-CM | POA: Diagnosis not present

## 2014-04-16 DIAGNOSIS — Z79899 Other long term (current) drug therapy: Secondary | ICD-10-CM | POA: Insufficient documentation

## 2014-04-16 DIAGNOSIS — T148XXA Other injury of unspecified body region, initial encounter: Secondary | ICD-10-CM

## 2014-04-16 MED ORDER — LORAZEPAM 1 MG PO TABS
1.0000 mg | ORAL_TABLET | Freq: Once | ORAL | Status: AC
  Administered 2014-04-16: 1 mg via ORAL
  Filled 2014-04-16: qty 1

## 2014-04-16 MED ORDER — IBUPROFEN 800 MG PO TABS
800.0000 mg | ORAL_TABLET | Freq: Once | ORAL | Status: DC
  Filled 2014-04-16: qty 1
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
## 2014-04-16 NOTE — Discharge Instructions (Signed)
Contusion °A contusion is a deep bruise. Contusions are the result of an injury that caused bleeding under the skin. The contusion may turn blue, purple, or yellow. Minor injuries will give you a painless contusion, but more severe contusions may stay painful and swollen for a few weeks.  °CAUSES  °A contusion is usually caused by a blow, trauma, or direct force to an area of the body. °SYMPTOMS  °· Swelling and redness of the injured area. °· Bruising of the injured area. °· Tenderness and soreness of the injured area. °· Pain. °DIAGNOSIS  °The diagnosis can be made by taking a history and physical exam. An X-ray, CT scan, or MRI may be needed to determine if there were any associated injuries, such as fractures. °TREATMENT  °Specific treatment will depend on what area of the body was injured. In general, the best treatment for a contusion is resting, icing, elevating, and applying cold compresses to the injured area. Over-the-counter medicines may also be recommended for pain control. Ask your caregiver what the best treatment is for your contusion. °HOME CARE INSTRUCTIONS  °· Put ice on the injured area. °¨ Put ice in a plastic bag. °¨ Place a towel between your skin and the bag. °¨ Leave the ice on for 15-20 minutes, 3-4 times a day, or as directed by your health care provider. °· Only take over-the-counter or prescription medicines for pain, discomfort, or fever as directed by your caregiver. Your caregiver may recommend avoiding anti-inflammatory medicines (aspirin, ibuprofen, and naproxen) for 48 hours because these medicines may increase bruising. °· Rest the injured area. °· If possible, elevate the injured area to reduce swelling. °SEEK IMMEDIATE MEDICAL CARE IF:  °· You have increased bruising or swelling. °· You have pain that is getting worse. °· Your swelling or pain is not relieved with medicines. °MAKE SURE YOU:  °· Understand these instructions. °· Will watch your condition. °· Will get help right  away if you are not doing well or get worse. °Document Released: 04/30/2005 Document Revised: 07/26/2013 Document Reviewed: 05/26/2011 °ExitCare® Patient Information ©2015 [employer]. This information is not intended to replace advice given to you by your health care provider. Make sure you discuss any questions you have with your health care provider. ° °

## 2014-04-16 NOTE — ED Notes (Signed)
While I was attempting to give the pt Ibuprofen, Dr. Blinda Leatherwood came into the room to inform the pt of the x-ray results. Pt requested something stronger for pain, Dr. Blinda Leatherwood stated he would only give NSAIDs. Pt refused the Ibuprofen after Dr. Blinda Leatherwood left the room. I started to wrap the pt's hand. Pt stated "If I lose my job because of that doctor, then he is going to lose his job." The pt's boyfriend stated "If you lose your job, then that doctor is going to lose more than his job. That's not a threat". I left the room and informed security, the charge nurse, the [facility], and the doctor.
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
## 2014-04-16 NOTE — ED Notes (Signed)
Pt had her right hand get caught in car door, pt also states she is having a panic attack and does not have her meds with her

## 2014-04-18 ENCOUNTER — Emergency Department (HOSPITAL_COMMUNITY)
Admission: EM | Admit: 2014-04-18 | Discharge: 2014-04-18 | Disposition: A | Discharge status: Home / Self Care | Payer: Managed Care, Other (non HMO) | Attending: Emergency Medicine | Admitting: Emergency Medicine

## 2014-04-18 ENCOUNTER — Emergency Department (HOSPITAL_COMMUNITY): Payer: Managed Care, Other (non HMO)

## 2014-04-18 ENCOUNTER — Encounter (HOSPITAL_COMMUNITY): Payer: Self-pay | Admitting: Emergency Medicine

## 2014-04-18 DIAGNOSIS — S6990XA Unspecified injury of unspecified wrist, hand and finger(s), initial encounter: Secondary | ICD-10-CM | POA: Insufficient documentation

## 2014-04-18 DIAGNOSIS — F411 Generalized anxiety disorder: Secondary | ICD-10-CM | POA: Diagnosis not present

## 2014-04-18 DIAGNOSIS — IMO0002 Reserved for concepts with insufficient information to code with codable children: Secondary | ICD-10-CM | POA: Insufficient documentation

## 2014-04-18 DIAGNOSIS — Y9289 Other specified places as the place of occurrence of the external cause: Secondary | ICD-10-CM | POA: Insufficient documentation

## 2014-04-18 DIAGNOSIS — F329 Major depressive disorder, single episode, unspecified: Secondary | ICD-10-CM | POA: Insufficient documentation

## 2014-04-18 DIAGNOSIS — M79641 Pain in right hand: Secondary | ICD-10-CM

## 2014-04-18 DIAGNOSIS — Y9389 Activity, other specified: Secondary | ICD-10-CM | POA: Diagnosis not present

## 2014-04-18 DIAGNOSIS — W230XXA Caught, crushed, jammed, or pinched between moving objects, initial encounter: Secondary | ICD-10-CM | POA: Diagnosis not present

## 2014-04-18 DIAGNOSIS — Z79899 Other long term (current) drug therapy: Secondary | ICD-10-CM | POA: Diagnosis not present

## 2014-04-18 DIAGNOSIS — F3289 Other specified depressive episodes: Secondary | ICD-10-CM | POA: Insufficient documentation

## 2014-04-18 DIAGNOSIS — F17200 Nicotine dependence, unspecified, uncomplicated: Secondary | ICD-10-CM | POA: Insufficient documentation

## 2014-04-18 NOTE — ED Provider Notes (Signed)
Medical screening examination/treatment/procedure(s) were performed by non-physician practitioner and as supervising physician I was immediately available for consultation/collaboration.   EKG Interpretation None        Tomasita Crumble, MD 04/18/14 2034

## 2014-04-18 NOTE — ED Notes (Signed)
PT walked out with out dc instructions.
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
## 2014-04-21 ENCOUNTER — Emergency Department: Payer: Self-pay | Admitting: Emergency Medicine

## 2014-06-05 ENCOUNTER — Encounter (HOSPITAL_COMMUNITY): Payer: Self-pay | Admitting: Emergency Medicine

## 2014-06-24 ENCOUNTER — Emergency Department (HOSPITAL_COMMUNITY)
Admission: EM | Admit: 2014-06-24 | Discharge: 2014-06-24 | Disposition: A | Discharge status: Home / Self Care | Payer: Managed Care, Other (non HMO) | Attending: Emergency Medicine | Admitting: Emergency Medicine

## 2014-06-24 ENCOUNTER — Emergency Department (HOSPITAL_COMMUNITY): Payer: Managed Care, Other (non HMO)

## 2014-06-24 ENCOUNTER — Encounter (HOSPITAL_COMMUNITY): Payer: Self-pay | Admitting: Emergency Medicine

## 2014-06-24 DIAGNOSIS — Y9389 Activity, other specified: Secondary | ICD-10-CM | POA: Insufficient documentation

## 2014-06-24 DIAGNOSIS — Z87891 Personal history of nicotine dependence: Secondary | ICD-10-CM | POA: Insufficient documentation

## 2014-06-24 DIAGNOSIS — S0993XA Unspecified injury of face, initial encounter: Secondary | ICD-10-CM | POA: Insufficient documentation

## 2014-06-24 DIAGNOSIS — S3991XA Unspecified injury of abdomen, initial encounter: Secondary | ICD-10-CM | POA: Diagnosis not present

## 2014-06-24 DIAGNOSIS — Y998 Other external cause status: Secondary | ICD-10-CM | POA: Diagnosis not present

## 2014-06-24 DIAGNOSIS — Z23 Encounter for immunization: Secondary | ICD-10-CM | POA: Diagnosis not present

## 2014-06-24 DIAGNOSIS — Z79899 Other long term (current) drug therapy: Secondary | ICD-10-CM | POA: Diagnosis not present

## 2014-06-24 DIAGNOSIS — J029 Acute pharyngitis, unspecified: Secondary | ICD-10-CM | POA: Diagnosis not present

## 2014-06-24 DIAGNOSIS — F419 Anxiety disorder, unspecified: Secondary | ICD-10-CM | POA: Insufficient documentation

## 2014-06-24 DIAGNOSIS — S0083XA Contusion of other part of head, initial encounter: Secondary | ICD-10-CM | POA: Insufficient documentation

## 2014-06-24 DIAGNOSIS — S29001A Unspecified injury of muscle and tendon of front wall of thorax, initial encounter: Secondary | ICD-10-CM | POA: Diagnosis not present

## 2014-06-24 DIAGNOSIS — Y9289 Other specified places as the place of occurrence of the external cause: Secondary | ICD-10-CM | POA: Diagnosis not present

## 2014-06-24 DIAGNOSIS — S199XXA Unspecified injury of neck, initial encounter: Secondary | ICD-10-CM

## 2014-06-24 DIAGNOSIS — Z3202 Encounter for pregnancy test, result negative: Secondary | ICD-10-CM | POA: Diagnosis not present

## 2014-06-24 DIAGNOSIS — F329 Major depressive disorder, single episode, unspecified: Secondary | ICD-10-CM | POA: Insufficient documentation

## 2014-06-24 LAB — POC URINE PREG, ED: Preg Test, Ur: NEGATIVE

## 2014-06-24 MED ORDER — OXYCODONE-ACETAMINOPHEN 5-325 MG PO TABS
1.0000 | ORAL_TABLET | Freq: Once | ORAL | Status: AC
  Administered 2014-06-24: 1 via ORAL
  Filled 2014-06-24: qty 1

## 2014-06-24 MED ORDER — BACITRACIN 500 UNIT/GM EX OINT
1.0000 "application " | TOPICAL_OINTMENT | Freq: Two times a day (BID) | CUTANEOUS | Status: DC
  Administered 2014-06-24: 1 via TOPICAL
  Filled 2014-06-24 (×4): qty 0.9

## 2014-06-24 MED ORDER — TETANUS-DIPHTH-ACELL PERTUSSIS 5-2.5-18.5 LF-MCG/0.5 IM SUSP
INTRAMUSCULAR | Status: AC
  Filled 2014-06-24: qty 0.5

## 2014-06-24 MED ORDER — OXYCODONE-ACETAMINOPHEN 5-325 MG PO TABS: 1.0000 | ORAL_TABLET | Freq: Four times a day (QID) | ORAL | Status: DC | PRN

## 2014-06-24 MED ORDER — NAPROXEN 500 MG PO TABS: 500.0000 mg | ORAL_TABLET | Freq: Two times a day (BID) | ORAL | Status: DC

## 2014-06-24 MED ORDER — BACITRACIN ZINC 500 UNIT/GM EX OINT
TOPICAL_OINTMENT | CUTANEOUS | Status: AC
  Filled 2014-06-24: qty 0.9

## 2014-06-24 MED ORDER — TETANUS-DIPHTH-ACELL PERTUSSIS 5-2.5-18.5 LF-MCG/0.5 IM SUSP
0.5000 mL | Freq: Once | INTRAMUSCULAR | Status: AC
  Administered 2014-06-24: 0.5 mL via INTRAMUSCULAR
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
## 2014-06-24 NOTE — ED Notes (Signed)
Pt verbalized understanding of no driving and to use caution within 4 hours of taking pain meds due to meds cause drowsiness 

## 2014-06-24 NOTE — Discharge Instructions (Signed)
Assault, General Assault includes any behavior, whether intentional or reckless, which results in bodily injury to another person and/or damage to property. Included in this would be any behavior, intentional or reckless, that by its nature would be understood (interpreted) by a reasonable person as intent to harm another person or to damage his/her property. Threats may be oral or written. They may be communicated through regular mail, computer, fax, or phone. These threats may be direct or implied. FORMS OF ASSAULT INCLUDE:  Physically assaulting a person. This includes physical threats to inflict physical harm as well as:  Slapping.  Hitting.  Poking.  Kicking.  Punching.  Pushing.  Arson.  Sabotage.  Equipment vandalism.  Damaging or destroying property.  Throwing or hitting objects.  Displaying a weapon or an object that appears to be a weapon in a threatening manner.  Carrying a firearm of any kind.  Using a weapon to harm someone.  Using greater physical size/strength to intimidate another.  Making intimidating or threatening gestures.  Bullying.  Hazing.  Intimidating, threatening, hostile, or abusive language directed toward another person.  It communicates the intention to engage in violence against that person. And it leads a reasonable person to expect that violent behavior may occur.  Stalking another person. IF IT HAPPENS AGAIN:  Immediately call for emergency help (911 in [place]  If someone poses clear and immediate danger to you, seek legal authorities to have a protective or restraining order put in place.  Less threatening assaults can at least be reported to authorities. STEPS TO TAKE IF A SEXUAL ASSAULT HAS HAPPENED  Go to an area of safety. This may include a shelter or staying with a friend. Stay away from the area where you have been attacked. A large percentage of sexual assaults are caused by a friend, relative or associate.  If  medications were given by your caregiver, take them as directed for the full length of time prescribed.  Only take over-the-counter or prescription medicines for pain, discomfort, or fever as directed by your caregiver.  If you have come in contact with a sexual disease, find out if you are to be tested again. If your caregiver is concerned about the HIV/AIDS virus, he/she may require you to have continued testing for several months.  For the protection of your privacy, test results can not be given over the phone. Make sure you receive the results of your test. If your test results are not back during your visit, make an appointment with your caregiver to find out the results. Do not assume everything is normal if you have not heard from your caregiver or the medical facility. It is important for you to follow up on all of your test results.  File appropriate papers with authorities. This is important in all assaults, even if it has occurred in a family or by a friend. SEEK MEDICAL CARE IF:  You have new problems because of your injuries.  You have problems that may be because of the medicine you are taking, such as:  Rash.  Itching.  Swelling.  Trouble breathing.  You develop belly (abdominal) pain, feel sick to your stomach (nausea) or are vomiting.  You begin to run a temperature.  You need supportive care or referral to a rape crisis center. These are centers with trained personnel who can help you get through this ordeal. SEEK IMMEDIATE MEDICAL CARE IF:  You are afraid of being threatened, beaten, or abused. In [place] call 911.  You  receive new injuries related to abuse.  You develop severe pain in any area injured in the assault or have any change in your condition that concerns you.  You faint or lose consciousness.  You develop chest pain or shortness of breath. Document Released: 07/21/2005 Document Revised: 10/13/2011 Document Reviewed: 03/08/2008 [facility]otaExitCare Patient  Information 2015 [place]Care, [place]LL[place]. This information is not intended to replace advice given to you by your health care provider. Make sure you discuss any questions you have with your health care provider.  Facial or Scalp Contusion  A facial or scalp contusion is a deep bruise on the face or head. Contusions happen when an injury causes bleeding under the skin. Signs of bruising include pain, puffiness (swelling), and discolored skin. The contusion may turn blue, purple, or yellow. HOME CARE  Only take medicines as told by your doctor.  Put ice on the injured area.  Put ice in a plastic bag.  Place a towel between your skin and the bag.  Leave the ice on for 20 minutes, 2-3 times a day. GET HELP IF:  You have bite problems.  You have pain when chewing.  You are worried about your face not healing normally. GET HELP RIGHT AWAY IF:   You have severe pain or a headache and medicine does not help.  You are very tired or confused, or your personality changes.  You throw up (vomit).  You have a nosebleed that will not stop.  You see two of everything (double vision) or have blurry vision.  You have fluid coming from your nose or ear.  You have problems walking or using your arms or legs. MAKE SURE YOU:   Understand these instructions.  Will watch your condition.  Will get help right away if you are not doing well or get worse. Document Released: 07/10/2011 Document Revised: 05/11/2013 Document Reviewed: 03/03/2013 [facility] LLCExitCare Patient Information 2015 [place]ExitCare, MarylandLLC. This information is not intended to replace advice given to you by your health care provider. Make sure you discuss any questions you have with your health care provider.
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
## 2014-06-24 NOTE — ED Notes (Signed)
Instructed pt for urine sample, asked for pt to try
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
## 2015-02-22 ENCOUNTER — Emergency Department (HOSPITAL_COMMUNITY)
Admission: EM | Admit: 2015-02-22 | Discharge: 2015-02-22 | Disposition: A | Discharge status: Home / Self Care | Payer: Medicaid Other | Attending: Emergency Medicine | Admitting: Emergency Medicine

## 2015-02-22 ENCOUNTER — Encounter (HOSPITAL_COMMUNITY): Payer: Self-pay | Admitting: Emergency Medicine

## 2015-02-22 DIAGNOSIS — Z87891 Personal history of nicotine dependence: Secondary | ICD-10-CM | POA: Insufficient documentation

## 2015-02-22 DIAGNOSIS — K088 Other specified disorders of teeth and supporting structures: Secondary | ICD-10-CM | POA: Diagnosis not present

## 2015-02-22 DIAGNOSIS — K0889 Other specified disorders of teeth and supporting structures: Secondary | ICD-10-CM

## 2015-02-22 DIAGNOSIS — Z8659 Personal history of other mental and behavioral disorders: Secondary | ICD-10-CM | POA: Insufficient documentation

## 2015-02-22 DIAGNOSIS — K029 Dental caries, unspecified: Secondary | ICD-10-CM | POA: Diagnosis not present

## 2015-02-22 DIAGNOSIS — R6884 Jaw pain: Secondary | ICD-10-CM | POA: Diagnosis present

## 2015-02-22 MED ORDER — DICLOFENAC SODIUM 75 MG PO TBEC
75.0000 mg | DELAYED_RELEASE_TABLET | Freq: Two times a day (BID) | ORAL | Status: DC
Start: 2015-02-22 — End: 2015-06-14

## 2015-02-22 MED ORDER — PENICILLIN V POTASSIUM 500 MG PO TABS: 500.0000 mg | ORAL_TABLET | Freq: Four times a day (QID) | ORAL | Status: AC

## 2015-02-22 NOTE — ED Notes (Signed)
Pt reports right jaw pain x2 days. Pt denies any other symptoms, injury. Pt reports pain is worse with opening mouth. Airway patent.

## 2015-02-22 NOTE — Discharge Instructions (Signed)

## 2015-02-23 NOTE — ED Provider Notes (Signed)
CSN: [id number]     Arrival date & time 02/22/15  1131 History   First MD Initiated Contact with Patient 02/22/15 1204     Chief Complaint  Patient presents with  . Jaw Pain     (Consider location/radiation/quality/duration/timing/severity/associated sxs/prior Treatment) HPI   Alexis [name] is a 24 y.o. female who presents to the Emergency Department complaining of dental pain for 2 days.  She reports pain to the right lower jaw and molars.  Pain is worse with chewing and she has sensitivity to hot and cold foods and liquids.  Also has pain with opening her mouth, but denies difficulty opening or closing her mouth.  She has taken Ibuprofen and tylenol without relief.  She denies neck pain, facial swelling, fever, and difficulty swallowing.     Past Medical History  Diagnosis Date  . Anxiety   . Depression    History reviewed. No pertinent past surgical history. History reviewed. No pertinent family history. History  Substance Use Topics  . Smoking status: Former Smoker    Types: Cigarettes  . Smokeless tobacco: Not on file  . Alcohol Use: No   OB History    Gravida Para Term Preterm AB TAB SAB Ectopic Multiple Living   1              Review of Systems  Constitutional: Negative for fever and appetite change.  HENT: Positive for dental problem. Negative for congestion, facial swelling, sore throat and trouble swallowing.   Eyes: Negative for pain and visual disturbance.  Musculoskeletal: Negative for neck pain and neck stiffness.  Neurological: Negative for dizziness, facial asymmetry and headaches.  Hematological: Negative for adenopathy.  All other systems reviewed and are negative.     Allergies  Tramadol and Vicodin  Home Medications   Prior to Admission medications   Medication Sig Start Date End Date Taking? Authorizing Provider  medroxyPROGESTERone (DEPO-PROVERA) 150 MG/ML injection Inject 150 mg into the muscle every 3 (three) months.   Yes Historical  Provider, MD  diclofenac (VOLTAREN) 75 MG EC tablet Take 1 tablet (75 mg total) by mouth 2 (two) times daily. Take with food 02/22/15   Dawnelle Warman, PA-C  naproxen (NAPROSYN) 500 MG tablet Take 1 tablet (500 mg total) by mouth 2 (two) times daily with a meal. Patient not taking: Reported on 02/22/2015 06/24/14   Rolonda Pontarelli, PA-C  oxyCODONE-acetaminophen (PERCOCET/ROXICET) 5-325 MG per tablet Take 1 tablet by mouth every 6 (six) hours as needed. Patient not taking: Reported on 02/22/2015 06/24/14   Amy Belloso, PA-C  penicillin v potassium (VEETID) 500 MG tablet Take 1 tablet (500 mg total) by mouth 4 (four) times daily. For 10 days 02/22/15 03/01/15  Deyona Soza, PA-C   BP 110/66 mmHg  Pulse 78  Temp(Src) 98.6 F (37 C) (Oral)  Resp 18  Ht  (1.803 m)  Wt 156 lb (70.761 kg)  BMI 21.77 kg/m2  SpO2 99%  LMP 02/22/2015 Physical Exam  Constitutional: She is oriented to person, place, and time. She appears well-developed and well-nourished. No distress.  HENT:  Head: Normocephalic and atraumatic.  Right Ear: Tympanic membrane and ear canal normal.  Left Ear: Tympanic membrane and ear canal normal.  Mouth/Throat: Uvula is midline, oropharynx is clear and moist and mucous membranes are normal. No trismus in the jaw. Dental caries present. No dental abscesses or uvula swelling.  Tenderness of the right lower gums and molars.  No facial swelling, obvious dental abscess, trismus, or sublingual abnml.  Neck: Normal range of motion. Neck supple.  Cardiovascular: Normal rate, regular rhythm and normal heart sounds.   No murmur heard. Pulmonary/Chest: Effort normal and breath sounds normal.  Musculoskeletal: Normal range of motion.  Lymphadenopathy:    She has no cervical adenopathy.  Neurological: She is alert and oriented to person, place, and time. She exhibits normal muscle tone. Coordination normal.  Skin: Skin is warm and dry.  Nursing note and vitals reviewed.   ED  Course  Procedures (including critical care time) Labs Review Labs Reviewed - No data to display  Imaging Review No results found.   EKG Interpretation None      MDM   Final diagnoses:  Pain, dental    Pt is well appearing.  Vitals stable.  No facial edema or concerning sx's for Ludwig's angina.  Pt given dental referral info.  Agrees to arrange f/u with a dentist.  rx for diclofenac and pen VK    Pauline Aus, PA-C 02/23/15 2220  Margarita Grizzle, MD 02/24/15 1730

## 2015-03-19 ENCOUNTER — Emergency Department (HOSPITAL_COMMUNITY)
Admission: EM | Admit: 2015-03-19 | Discharge: 2015-03-19 | Disposition: A | Discharge status: Home / Self Care | Payer: Medicaid Other | Attending: Emergency Medicine | Admitting: Emergency Medicine

## 2015-03-19 ENCOUNTER — Encounter (HOSPITAL_COMMUNITY): Payer: Self-pay | Admitting: Emergency Medicine

## 2015-03-19 DIAGNOSIS — K088 Other specified disorders of teeth and supporting structures: Secondary | ICD-10-CM | POA: Diagnosis not present

## 2015-03-19 DIAGNOSIS — Z87891 Personal history of nicotine dependence: Secondary | ICD-10-CM | POA: Insufficient documentation

## 2015-03-19 DIAGNOSIS — K0889 Other specified disorders of teeth and supporting structures: Secondary | ICD-10-CM

## 2015-03-19 DIAGNOSIS — K029 Dental caries, unspecified: Secondary | ICD-10-CM | POA: Diagnosis not present

## 2015-03-19 DIAGNOSIS — Z8659 Personal history of other mental and behavioral disorders: Secondary | ICD-10-CM | POA: Insufficient documentation

## 2015-03-19 MED ORDER — CLINDAMYCIN HCL 150 MG PO CAPS
300.0000 mg | ORAL_CAPSULE | Freq: Once | ORAL | Status: AC
  Administered 2015-03-19: 300 mg via ORAL

## 2015-03-19 MED ORDER — ACETAMINOPHEN 325 MG PO TABS
650.0000 mg | ORAL_TABLET | Freq: Once | ORAL | Status: AC
  Administered 2015-03-19: 650 mg via ORAL

## 2015-03-19 MED ORDER — ACETAMINOPHEN 325 MG PO TABS
ORAL_TABLET | ORAL | Status: AC
  Filled 2015-03-19: qty 2

## 2015-03-19 MED ORDER — ONDANSETRON HCL 4 MG PO TABS
ORAL_TABLET | ORAL | Status: AC
  Filled 2015-03-19: qty 1

## 2015-03-19 MED ORDER — IBUPROFEN 800 MG PO TABS
800.0000 mg | ORAL_TABLET | Freq: Once | ORAL | Status: AC
Start: 2015-03-19 — End: 2015-03-19
  Administered 2015-03-19: 800 mg via ORAL

## 2015-03-19 MED ORDER — CLINDAMYCIN HCL 150 MG PO CAPS
ORAL_CAPSULE | ORAL | Status: AC
  Filled 2015-03-19: qty 2

## 2015-03-19 MED ORDER — ONDANSETRON HCL 4 MG PO TABS
4.0000 mg | ORAL_TABLET | Freq: Once | ORAL | Status: AC
  Administered 2015-03-19: 4 mg via ORAL

## 2015-03-19 MED ORDER — CLINDAMYCIN HCL 300 MG PO CAPS: 300.0000 mg | ORAL_CAPSULE | Freq: Three times a day (TID) | ORAL | Status: DC

## 2015-03-19 MED ORDER — IBUPROFEN 800 MG PO TABS
ORAL_TABLET | ORAL | Status: AC
  Filled 2015-03-19: qty 1

## 2015-03-19 NOTE — Discharge Instructions (Signed)
Please stop the penicillin for now. Please use clindamycin 3 times daily with food. Please use 600 mg of ibuprofen and 500 mg of Tylenol with breakfast, lunch, dinner, and at bedtime. Please see Dr. Neva Seat or member of his team as sone as possible for assistance with your dental issue.

## 2015-03-19 NOTE — ED Provider Notes (Signed)
CSN: [id number]     Arrival date & time 03/19/15  1119 History  This chart was scribed for non-physician practitioner Ivery Quale, PA-C, working with Linwood Dibbles, MD by Littie Deeds, ED Scribe. This patient was seen in room APFT22/APFT22 and the patient's care was started at 11:59 AM.      Chief Complaint  Patient presents with  . Dental Pain   The history is provided by the patient. No language interpreter was used.   HPI Comments: Alexis [name] is a 24 y.o. female who presents to the Emergency Department complaining of gradual onset left upper and lower dental pain that started about 1 week ago. Patient states she has some tooth decay. She has been taking ibuprofen and penicillin that she was prescribed for dental pain last month. She did not follow-up with a dentist at that time and does not have a dentist currently. No known allergies to clindamycin.  Past Medical History  Diagnosis Date  . Anxiety   . Depression    History reviewed. No pertinent past surgical history. History reviewed. No pertinent family history. Social History  Substance Use Topics  . Smoking status: Former Smoker    Types: Cigarettes  . Smokeless tobacco: None  . Alcohol Use: No   OB History    Gravida Para Term Preterm AB TAB SAB Ectopic Multiple Living   1              Review of Systems  HENT: Positive for dental problem.   All other systems reviewed and are negative.     Allergies  Tramadol and Vicodin  Home Medications   Prior to Admission medications   Medication Sig Start Date End Date Taking? Authorizing Provider  ibuprofen (ADVIL,MOTRIN) 200 MG tablet Take 800 mg by mouth every 6 (six) hours as needed for mild pain.   Yes Historical Provider, MD  diclofenac (VOLTAREN) 75 MG EC tablet Take 1 tablet (75 mg total) by mouth 2 (two) times daily. Take with food Patient not taking: Reported on 03/19/2015 02/22/15   Tammy Triplett, PA-C  naproxen (NAPROSYN) 500 MG tablet Take 1 tablet (500 mg  total) by mouth 2 (two) times daily with a meal. Patient not taking: Reported on 02/22/2015 06/24/14   Tammy Triplett, PA-C  oxyCODONE-acetaminophen (PERCOCET/ROXICET) 5-325 MG per tablet Take 1 tablet by mouth every 6 (six) hours as needed. Patient not taking: Reported on 02/22/2015 06/24/14   Tammy Triplett, PA-C   BP 115/69 mmHg  Pulse 78  Temp(Src) 97.8 F (36.6 C) (Oral)  Resp 18  Ht 5\' 11"  (1.803 m)  Wt 154 lb (69.854 kg)  BMI 21.49 kg/m2  SpO2 98%  LMP 02/22/2015 Physical Exam  Constitutional: She is oriented to person, place, and time. She appears well-developed and well-nourished. No distress.  HENT:  Head: Normocephalic and atraumatic.  Mouth/Throat: Oropharynx is clear and moist. No oropharyngeal exudate.  No loss of nasal labial fold. No facial abnormality. Deep cavity of the left upper premolar. Swelling of the upper gum. No swelling under the tongue.  Eyes: Pupils are equal, round, and reactive to light.  Neck: Neck supple.  No lymphadenopathy.  Cardiovascular: Normal rate, regular rhythm and normal heart sounds.  Exam reveals no gallop and no friction rub.   No murmur heard. Normal cardiac.  Pulmonary/Chest: Effort normal and breath sounds normal. No respiratory distress. She has no wheezes. She has no rales.  Airway patent. Normal pulmonary.  Musculoskeletal: She exhibits no edema.  Lymphadenopathy:  She has no cervical adenopathy.  Neurological: She is alert and oriented to person, place, and time. No cranial nerve deficit.  Skin: Skin is warm and dry. No rash noted.  Psychiatric: She has a normal mood and affect. Her behavior is normal.  Nursing note and vitals reviewed.   ED Course  Procedures  DIAGNOSTIC STUDIES: Oxygen Saturation is 98% on room air, normal by my interpretation.    COORDINATION OF CARE: 12:03 PM-Discussed treatment plan which includes clindamycin with patient/guardian at bedside and patient/guardian agreed to plan. Referral to Dr.  Neva Seat and Dr. Gala Romney.   Labs Review Labs Reviewed - No data to display  Imaging Review No results found. I personally reviewed and evaluated these images and lab results as part of my medical decision-making.   EKG Interpretation None      MDM  Vital signs are well within normal limits. There is no evidence for Ludwig's angina present. I have given the pace resources for Dr. Neva Seat in [place]. Clindamycin started to replace the penicillin for now. She was given resource information for dentist here in the [place] on her previous visit to the emergency department. The patient strongly encouraged to see a dentist as soon as possible.    Final diagnoses:  None    **I have reviewed nursing notes, vital signs, and all appropriate lab and imaging results for this patient.  *I personally performed the services described in this documentation, which was scribed in my presence. The recorded information has been reviewed and is accurate.   Ivery Quale, PA-C 03/19/15 1218  Linwood Dibbles, MD 03/20/15 [phone]

## 2015-03-19 NOTE — ED Notes (Addendum)
Patient complaining of upper left side dental pain x 1 week.

## 2015-04-22 ENCOUNTER — Encounter: Payer: Self-pay | Admitting: Emergency Medicine

## 2015-04-22 ENCOUNTER — Emergency Department
Admission: EM | Admit: 2015-04-22 | Discharge: 2015-04-22 | Disposition: A | Discharge status: Home / Self Care | Payer: Medicaid Other | Attending: Emergency Medicine | Admitting: Emergency Medicine

## 2015-04-22 DIAGNOSIS — Z792 Long term (current) use of antibiotics: Secondary | ICD-10-CM | POA: Diagnosis not present

## 2015-04-22 DIAGNOSIS — Z87891 Personal history of nicotine dependence: Secondary | ICD-10-CM | POA: Insufficient documentation

## 2015-04-22 DIAGNOSIS — F329 Major depressive disorder, single episode, unspecified: Secondary | ICD-10-CM | POA: Insufficient documentation

## 2015-04-22 DIAGNOSIS — Y9289 Other specified places as the place of occurrence of the external cause: Secondary | ICD-10-CM | POA: Diagnosis not present

## 2015-04-22 DIAGNOSIS — F410 Panic disorder [episodic paroxysmal anxiety] without agoraphobia: Secondary | ICD-10-CM | POA: Insufficient documentation

## 2015-04-22 DIAGNOSIS — Z8659 Personal history of other mental and behavioral disorders: Secondary | ICD-10-CM

## 2015-04-22 DIAGNOSIS — Z79899 Other long term (current) drug therapy: Secondary | ICD-10-CM | POA: Insufficient documentation

## 2015-04-22 DIAGNOSIS — Y9389 Activity, other specified: Secondary | ICD-10-CM | POA: Insufficient documentation

## 2015-04-22 DIAGNOSIS — Y998 Other external cause status: Secondary | ICD-10-CM | POA: Diagnosis not present

## 2015-04-22 DIAGNOSIS — S46911A Strain of unspecified muscle, fascia and tendon at shoulder and upper arm level, right arm, initial encounter: Secondary | ICD-10-CM | POA: Diagnosis not present

## 2015-04-22 DIAGNOSIS — Z760 Encounter for issue of repeat prescription: Secondary | ICD-10-CM | POA: Diagnosis present

## 2015-04-22 DIAGNOSIS — X58XXXA Exposure to other specified factors, initial encounter: Secondary | ICD-10-CM | POA: Diagnosis not present

## 2015-04-22 MED ORDER — CLONAZEPAM 1 MG PO TABS: 1.0000 mg | ORAL_TABLET | Freq: Two times a day (BID) | ORAL | Status: DC

## 2015-04-22 NOTE — ED Notes (Signed)
Pt reports panic attacks for years but is out of medication at this time. States she has been out of her medication for months while she was visiting with family. Denies SI/HI pt also pain in right shoulder for past week. Unknown injury

## 2015-04-22 NOTE — Discharge Instructions (Signed)
You must follow-up with [facility] for ongoing treatment of your anxiety and depression. You should also select a primary care provider for your medicine management. You may not receive these chronic medication refills from the Emergency Department, in the future.
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
## 2015-04-22 NOTE — ED Notes (Signed)
AAOx3.  Skin warm and dry.  NAD. D/C home/  Calm and cooperative

## 2015-06-03 ENCOUNTER — Emergency Department (HOSPITAL_COMMUNITY)
Admission: EM | Admit: 2015-06-03 | Discharge: 2015-06-04 | Disposition: A | Discharge status: Home / Self Care | Payer: 59 | Attending: Emergency Medicine | Admitting: Emergency Medicine

## 2015-06-03 ENCOUNTER — Encounter (HOSPITAL_COMMUNITY): Payer: Self-pay | Admitting: *Deleted

## 2015-06-03 DIAGNOSIS — Y998 Other external cause status: Secondary | ICD-10-CM | POA: Insufficient documentation

## 2015-06-03 DIAGNOSIS — Z792 Long term (current) use of antibiotics: Secondary | ICD-10-CM | POA: Diagnosis not present

## 2015-06-03 DIAGNOSIS — W228XXA Striking against or struck by other objects, initial encounter: Secondary | ICD-10-CM | POA: Diagnosis not present

## 2015-06-03 DIAGNOSIS — Z8781 Personal history of (healed) traumatic fracture: Secondary | ICD-10-CM

## 2015-06-03 DIAGNOSIS — S99922A Unspecified injury of left foot, initial encounter: Secondary | ICD-10-CM | POA: Diagnosis present

## 2015-06-03 DIAGNOSIS — Z87891 Personal history of nicotine dependence: Secondary | ICD-10-CM | POA: Insufficient documentation

## 2015-06-03 DIAGNOSIS — F419 Anxiety disorder, unspecified: Secondary | ICD-10-CM | POA: Insufficient documentation

## 2015-06-03 DIAGNOSIS — F329 Major depressive disorder, single episode, unspecified: Secondary | ICD-10-CM | POA: Diagnosis not present

## 2015-06-03 DIAGNOSIS — Y9289 Other specified places as the place of occurrence of the external cause: Secondary | ICD-10-CM | POA: Insufficient documentation

## 2015-06-03 DIAGNOSIS — Y9389 Activity, other specified: Secondary | ICD-10-CM | POA: Diagnosis not present

## 2015-06-03 DIAGNOSIS — S92512A Displaced fracture of proximal phalanx of left lesser toe(s), initial encounter for closed fracture: Secondary | ICD-10-CM | POA: Insufficient documentation

## 2015-06-03 DIAGNOSIS — S92912A Unspecified fracture of left toe(s), initial encounter for closed fracture: Secondary | ICD-10-CM

## 2015-06-03 MED ORDER — OXYCODONE-ACETAMINOPHEN 5-325 MG PO TABS
1.0000 | ORAL_TABLET | Freq: Once | ORAL | Status: AC
  Administered 2015-06-04: 1 via ORAL
  Filled 2015-06-03: qty 1

## 2015-06-03 NOTE — ED Notes (Addendum)
Deformity to 4th toe on the left foot.

## 2015-06-03 NOTE — ED Provider Notes (Signed)
CSN: [id number]     Arrival date & time 06/03/15  2331 History  By signing my name below, I, Alexis [name], attest that this documentation has been prepared under the direction and in the presence of Shon Baton, MD. Electronically Signed: Octavia Heir, ED Scribe. 06/03/2015. 11:53 PM.    Chief Complaint  Patient presents with  . Toe Injury      The history is provided by the patient. No language interpreter was used.   HPI Comments: Alexis [name] is a 24 y.o. female who presents to the Emergency Department complaining of a right toe injury onset this evening. Pt rates her current pain a 10/10. She reports that she was taking her trash out in the dark when she hit her toes on a wooden chair. She has no known drug allergies.  Past Medical History  Diagnosis Date  . Anxiety   . Depression    History reviewed. No pertinent past surgical history. History reviewed. No pertinent family history. Social History  Substance Use Topics  . Smoking status: Former Smoker    Types: Cigarettes  . Smokeless tobacco: None  . Alcohol Use: No   OB History    Gravida Para Term Preterm AB TAB SAB Ectopic Multiple Living   1              Review of Systems  Musculoskeletal:       Toe pain  Neurological: Negative for numbness.  All other systems reviewed and are negative.     Allergies  Tramadol and Vicodin  Home Medications   Prior to Admission medications   Medication Sig Start Date End Date Taking? Authorizing Provider  clindamycin (CLEOCIN) 300 MG capsule Take 1 capsule (300 mg total) by mouth 3 (three) times daily. 03/19/15   Ivery Quale, PA-C  clonazePAM (KLONOPIN) 1 MG tablet Take 1 tablet (1 mg total) by mouth 2 (two) times daily. 04/22/15   Jenise V Bacon Menshew, PA-C  diclofenac (VOLTAREN) 75 MG EC tablet Take 1 tablet (75 mg total) by mouth 2 (two) times daily. Take with food Patient not taking: Reported on 03/19/2015 02/22/15   Tammy Triplett, PA-C  ibuprofen  (ADVIL,MOTRIN) 200 MG tablet Take 800 mg by mouth every 6 (six) hours as needed for mild pain.    Historical Provider, MD  naproxen (NAPROSYN) 500 MG tablet Take 1 tablet (500 mg total) by mouth 2 (two) times daily with a meal. Patient not taking: Reported on 02/22/2015 06/24/14   Tammy Triplett, PA-C  oxyCODONE-acetaminophen (PERCOCET/ROXICET) 5-325 MG tablet Take 1 tablet by mouth every 6 (six) hours as needed. 06/04/15   Shon Baton, MD   Triage vitals: BP 107/81 mmHg  Pulse 95  Temp(Src) 98 F (36.7 C)  Resp 22  Ht  (1.778 m)  Wt 171 lb (77.565 kg)  BMI 24.54 kg/m2  SpO2 100% Physical Exam  Constitutional: She is oriented to person, place, and time. She appears well-developed and well-nourished. No distress.  HENT:  Head: Normocephalic and atraumatic.  Cardiovascular: Normal rate and regular rhythm.   Pulmonary/Chest: Effort normal. No respiratory distress.  Musculoskeletal:  Deformity with angulation noted of the fourth left digit, neurovascularly intact, no open wounds noted  Neurological: She is alert and oriented to person, place, and time.  Skin: Skin is warm and dry.  Psychiatric:  Anxious and tearful  Nursing note and vitals reviewed.   ED Course  Reduction of fracture Date/Time: 06/04/2015 2:23 AM Performed by: Shon Baton  Authorized by: Ross MarcusHORTON, COURTNEY F Consent: Verbal consent obtained. Consent given by: patient Local anesthesia used: no Patient sedated: no Patient tolerance: Patient tolerated the procedure well with no immediate complications Comments: Close reduction of fourth left toe without compensation    DIAGNOSTIC STUDIES: Oxygen Saturation is 100% on RA, normal by my interpretation.  COORDINATION OF CARE:  11:53 PM Discussed treatment plan which includes x-ray of left foot with pt at bedside and pt agreed to plan.  Labs Review Labs Reviewed - No data to display  Imaging Review Dg Toe 4th Left  06/04/2015  CLINICAL DATA:   Struck toe on wooden chair earlier tonight. EXAM: LEFT FOURTH TOE COMPARISON:  None. FINDINGS: Comminuted acute fracture fourth proximal phalanx diaphysis with slight lateral angulation of the distal bony fragments. No intra-articular extension. No dislocation. No destructive bony lesions. Soft tissue planes are nonsuspicious. IMPRESSION: Acute displaced fourth proximal phalanx fracture without dislocation. Electronically Signed   By: Awilda Metroourtnay  Bloomer M.D.   On: 06/04/2015 02:12   I have personally reviewed and evaluated these images and lab results as part of my medical decision-making.   EKG Interpretation None      MDM   Final diagnoses:  H/O reduction of closed fracture  Toe fracture, left, closed, initial encounter    Patient presents with pain to the left fourth digit. Otherwise nontoxic and without injury. There is obvious deformity on exam. X-ray show displaced fourth proximal phalanx fracture without dislocation. This was reduced at the bedside with good clinical approximation. Buddy tape applied. Patient was given a postop shoe. Postreduction films reviewed by myself and show improved alignment. Patient was given follow-up information and postreduction care instructions.  After history, exam, and medical workup I feel the patient has been appropriately medically screened and is safe for discharge home. Pertinent diagnoses were discussed with the patient. Patient was given return precautions.  I personally performed the services described in this documentation, which was scribed in my presence. The recorded information has been reviewed and is accurate.   Shon Batonourtney F Horton, MD 06/04/15 [id number]

## 2015-06-04 ENCOUNTER — Emergency Department (HOSPITAL_COMMUNITY): Payer: 59

## 2015-06-04 DIAGNOSIS — S92512A Displaced fracture of proximal phalanx of left lesser toe(s), initial encounter for closed fracture: Secondary | ICD-10-CM | POA: Diagnosis not present

## 2015-06-04 MED ORDER — OXYCODONE-ACETAMINOPHEN 5-325 MG PO TABS
ORAL_TABLET | ORAL | Status: AC
  Administered 2015-06-04: 1 via ORAL
  Filled 2015-06-04: qty 1

## 2015-06-04 MED ORDER — OXYCODONE-ACETAMINOPHEN 5-325 MG PO TABS
1.0000 | ORAL_TABLET | Freq: Once | ORAL | Status: AC
  Administered 2015-06-04: 1 via ORAL

## 2015-06-04 MED ORDER — OXYCODONE-ACETAMINOPHEN 5-325 MG PO TABS: 1.0000 | ORAL_TABLET | Freq: Four times a day (QID) | ORAL | Status: DC | PRN

## 2015-06-04 MED ORDER — HYDROMORPHONE HCL 1 MG/ML IJ SOLN
1.0000 mg | Freq: Once | INTRAMUSCULAR | Status: AC
Start: 2015-06-04 — End: 2015-06-04
  Administered 2015-06-04: 1 mg via INTRAMUSCULAR
  Filled 2015-06-04: qty 1

## 2015-06-04 NOTE — ED Notes (Signed)
Pt tried ambulating in the room with the post-op shoe. Pt requesting crutches and a work note. EDP aware and orders received.

## 2015-06-04 NOTE — Discharge Instructions (Signed)
You were seen today and found to have a broken toe. You should keep the toe taped for 4-6 weeks. It should heal well on its own. You will be given orthopedic follow-up if needed. You will be provided with pain medication and a postop shoe for comfort.  Toe Fracture A toe fracture is a break in one of the toe bones (phalanges). CAUSES This condition may be caused by:  Dropping a heavy object on your toe.  Stubbing your toe.  Overusing your toe or doing repetitive exercise.  Twisting or stretching your toe out of place. RISK FACTORS This condition is more likely to develop in people who:  Play contact sports.  Have a bone disease.  Have a low calcium level. SYMPTOMS The main symptoms of this condition are swelling and pain in the toe. The pain may get worse with standing or walking. Other symptoms include:  Bruising.  Stiffness.  Numbness.  A change in the way the toe looks.  Broken bones that poke through the skin.  Blood beneath the toenail. DIAGNOSIS This condition is diagnosed with a physical exam. You may also have X-rays. TREATMENT  Treatment for this condition depends on the type of fracture and its severity. Treatment may involve:  Taping the broken toe to a toe that is next to it (buddy taping). This is the most common treatment for fractures in which the bone has not moved out of place (nondisplaced fracture).  Wearing a shoe that has a wide, rigid sole to protect the toe and to limit its movement.  Wearing a walking cast.  Having a procedure to move the toe back into place.  Surgery. This may be needed:  If there are many pieces of broken bone that are out of place (displaced).  If the toe joint breaks.  If the bone breaks through the skin.  Physical therapy. This is done to help regain movement and strength in the toe. You may need follow-up X-rays to make sure that the bone is healing well and staying in position. HOME CARE INSTRUCTIONS If You  Have a Cast:  Do not stick anything inside the cast to scratch your skin. Doing that increases your risk of infection.  Check the skin around the cast every day. Report any concerns to your health care provider. You may put lotion on dry skin around the edges of the cast. Do not apply lotion to the skin underneath the cast.  Do not put pressure on any part of the cast until it is fully hardened. This may take several hours.  Keep the cast clean and dry. Bathing  Do not take baths, swim, or use a hot tub until your health care provider approves. Ask your health care provider if you can take showers. You may only be allowed to take sponge baths for bathing.  If your health care provider approves bathing and showering, cover the cast or bandage (dressing) with a watertight plastic bag to protect it from water. Do not let the cast or dressing get wet. Managing Pain, Stiffness, and Swelling  If you do not have a cast, apply ice to the injured area, if directed.  Put ice in a plastic bag.  Place a towel between your skin and the bag.  Leave the ice on for 20 minutes, 2-3 times per day.  Move your toes often to avoid stiffness and to lessen swelling.  Raise (elevate) the injured area above the level of your heart while you are sitting or  lying down. Driving  Do not drive or operate heavy machinery while taking pain medicine.  Do not drive while wearing a cast on a foot that you use for driving. Activity  Return to your normal activities as directed by your health care provider. Ask your health care provider what activities are safe for you.  Perform exercises daily as directed by your health care provider or physical therapist. Safety  Do not use the injured limb to support your body weight until your health care provider says that you can. Use crutches or other assistive devices as directed by your health care provider. General Instructions  If your toe was treated with buddy  taping, follow your health care provider's instructions for changing the gauze and tape. Change it more often:  The gauze and tape get wet. If this happens, dry the space between the toes.  The gauze and tape are too tight and cause your toe to become pale or numb.  Wear a protective shoe as directed by your health care provider. If you were not given a protective shoe, wear sturdy, supportive shoes. Your shoes should not pinch your toes and should not fit tightly against your toes.  Do not use any tobacco products, including cigarettes, chewing tobacco, or e-cigarettes. Tobacco can delay bone healing. If you need help quitting, ask your health care provider.  Take medicines only as directed by your health care provider.  Keep all follow-up visits as directed by your health care provider. This is important. SEEK MEDICAL CARE IF:  You have a fever.  Your pain medicine is not helping.  Your toe is cold.  Your toe is numb.  You still have pain after one week of rest and treatment.  You still have pain after your health care provider has said that you can start walking again.  You have pain, tingling, or numbness in your foot that is not going away. SEEK IMMEDIATE MEDICAL CARE IF:  You have severe pain.  You have redness or inflammation in your toe that is getting worse.  You have pain or numbness in your toe that is getting worse.  Your toe turns blue.   This information is not intended to replace advice given to you by your health care provider. Make sure you discuss any questions you have with your health care provider.   Document Released: 07/18/2000 Document Revised: 04/11/2015 Document Reviewed: 05/17/2014 Elsevier Interactive Patient Education [employer]

## 2015-06-11 ENCOUNTER — Telehealth: Payer: Self-pay | Admitting: *Deleted

## 2015-06-11 ENCOUNTER — Telehealth: Payer: Self-pay | Admitting: Orthopedic Surgery

## 2015-06-11 NOTE — Telephone Encounter (Signed)
Call received from patient, relays that she was seen at [facility] for toe fracture of left foot, date of injury 06/03/15.  Today, 06/11/15 is the first date we had been contacted.  Appointment offered as self-pay, as patient states she was notified that her Medicaid termed at the end of October of 2016.  States she may be covered under [employer] insurance plan, however, she was unable to provide any detailed information.  Appointment pending, as patient said she cannot scheduled as self-pay, due to financial reasons.  Patient ph# is [phone].

## 2015-06-11 NOTE — Telephone Encounter (Signed)
[name] called today following up from [facility] visit 05/29/15, awaiting referral from primary care insurance requirement a referral.

## 2015-06-12 ENCOUNTER — Ambulatory Visit: Payer: 59 | Admitting: Orthopedic Surgery

## 2015-06-13 NOTE — Telephone Encounter (Signed)
On 06/11/15, insurance was verified; appointment scheduled for 06/12/15; patient made aware. (Note: patient re-scheduled appointment, as unable to come to office for morning appointment time; therefore, scheduled for 06/14/15; aware.

## 2015-06-14 ENCOUNTER — Encounter: Payer: Self-pay | Admitting: Orthopedic Surgery

## 2015-06-14 ENCOUNTER — Ambulatory Visit (INDEPENDENT_AMBULATORY_CARE_PROVIDER_SITE_OTHER): Payer: 59 | Admitting: Orthopedic Surgery

## 2015-06-14 VITALS — BP 107/42 | Ht 70.0 in | Wt 171.0 lb

## 2015-06-14 DIAGNOSIS — S92912A Unspecified fracture of left toe(s), initial encounter for closed fracture: Secondary | ICD-10-CM | POA: Diagnosis not present

## 2015-06-14 NOTE — Patient Instructions (Addendum)
She needs a work note to be on light duty, no lifting.

## 2015-06-14 NOTE — Telephone Encounter (Signed)
See appointment notes; appointment is scheduled.

## 2015-06-14 NOTE — Progress Notes (Signed)
Patient ID: Alexis Alexis [name], female   DOB: 5/18/Alexis Snowball1992, 24 y.o.   MRN: [id number]  Chief Complaint  Patient presents with  . Toe Injury    er follow up left forth toe fracture, DOI 06/03/15    HPI Alexis Alexis [name] is a 24 y.o. female.   Pain: left 4th toe Ache Moderate 11 days    Review of Systems Review of Systems  All other systems reviewed and are negative.   Past Medical History  Diagnosis Date  . Anxiety   . Depression     No past surgical history reported    Social History Social History  Substance Use Topics  . Smoking status: Former Smoker    Types: Cigarettes  . Smokeless tobacco: Not Alexis [name] file  . Alcohol Use: No    Allergies  Allergen Reactions  . Tramadol Hives and Other (See Comments)    "seizure like activity."  . Vicodin [Hydrocodone-Acetaminophen] Nausea And Vomiting    No current outpatient prescriptions Alexis [name] file.   No current facility-administered medications for this visit.       Physical Exam Physical Exam Blood pressure 107/42, height 5\' 10"  (1.778 m), weight 171 lb (77.565 kg). Appearance, there are no abnormalities in terms of appearance the patient was well-developed and well-nourished. The grooming and hygiene were normal.  Mental status orientation, there was normal alertness and orientation Mood pleasant Ambulatory status abnormal with limp Examination of the left foot  Inspection left 4th toe alignment normal  Range of motion normal  Tests for stability normal  Motor strength  No atrophy Skin warm dry and intact without laceration or ulceration or erythema Neurologic examination normal sensation Vascular examination normal pulses with warm extremity and normal capillary refill      Data Reviewed xrays I reviewed the x-ray to determine the treatment of the fracture and after I looked at it she had 2 sets of films 1 with a angulated proximal phalanx fracture the second after reduction with alignment  normal  Assessment  Left fourth toe fracture   Plan  Buddy tape Out of work October 30 to December 5 Return December 5 x-ray left foot Prescription Norco 7.5 mg every 4 when necessary pain #84

## 2015-06-18 ENCOUNTER — Telehealth: Payer: Self-pay | Admitting: Orthopedic Surgery

## 2015-06-18 ENCOUNTER — Encounter: Payer: Self-pay | Admitting: Orthopedic Surgery

## 2015-06-18 NOTE — Telephone Encounter (Signed)
Patient called to relay that she is having a difficult time with toe fracture; I reviewed the patient instructions noted on her visit summary; states she never broke a bone and "is just worrying about it." Her next scheduled appointment is 07/09/15.  Patient also states that her employer, [employer], has no light duty work available; therefore, will need an updated note to be out of work, as Dr Romeo AppleHarrison initially recommended.  Note provided.  Please advise regarding fracture concerns.  Her ph# is [phone]

## 2015-06-19 NOTE — Telephone Encounter (Signed)
Routing to Dr Harrison for review 

## 2015-06-21 NOTE — Telephone Encounter (Signed)
Patient stopped in;  states Dr Romeo AppleHarrison had tried to call, however, it was from a private ph#; also now has a question regarding medication.  Ph# [phone]

## 2015-06-26 NOTE — Telephone Encounter (Signed)
Patient states still awaiting a call back regarding fracture and questions regarding medication (states Dr Romeo AppleHarrison had called from a private #; therefore, she didn't get to speak with him. I asked her for more detailed information about medication question -- she said she had broken out in a rash, assuming it was the Tylenol ingredient in the Norco.  States she is therefore not taking anything for the foot/toe pain other than aspirin.  Her next scheduled app't is 07/09/15.  Please call patient to advise at [phone].

## 2015-06-26 NOTE — Telephone Encounter (Signed)
Routing to Dr Harrison 

## 2015-07-03 NOTE — Telephone Encounter (Signed)
Allergic rxn  Stop medicine causing it and take ibuprofen 800 mg tid

## 2015-07-03 NOTE — Telephone Encounter (Signed)
Called patient, no answer, left vm 

## 2015-07-04 NOTE — Telephone Encounter (Signed)
Patient aware, states she is allergic to ibuprofen, will discuss options at office visit

## 2015-07-09 ENCOUNTER — Encounter: Payer: Self-pay | Admitting: Orthopedic Surgery

## 2015-07-09 ENCOUNTER — Ambulatory Visit (INDEPENDENT_AMBULATORY_CARE_PROVIDER_SITE_OTHER): Payer: 59

## 2015-07-09 ENCOUNTER — Ambulatory Visit (INDEPENDENT_AMBULATORY_CARE_PROVIDER_SITE_OTHER): Payer: Self-pay | Admitting: Orthopedic Surgery

## 2015-07-09 VITALS — BP 100/69 | Ht 70.0 in | Wt 171.0 lb

## 2015-07-09 DIAGNOSIS — S92912D Unspecified fracture of left toe(s), subsequent encounter for fracture with routine healing: Secondary | ICD-10-CM

## 2015-07-09 MED ORDER — HYDROCODONE-ACETAMINOPHEN 5-325 MG PO TABS: 1.0000 | ORAL_TABLET | Freq: Every day | ORAL | Status: DC

## 2015-07-09 NOTE — Addendum Note (Signed)
Addended by: Adella HareBOOTHE, Jelissa Espiritu B on: 07/09/2015 02:43 PM   Modules accepted: Orders

## 2015-07-09 NOTE — Progress Notes (Signed)
Fracture care follow up   Chief Complaint  Patient presents with  . Follow-up    4 week follow up + xray left foot, 4th metatarsal fx, DOI 06/03/15   C/o mild pain wishes to go back to work.  xrays show acceptable alignment; she has mild tenderness but clinically she has normal alignment.  Ret to work Dec 6th  Discharge

## 2015-07-09 NOTE — Patient Instructions (Addendum)
Patient needs a RTW note for 07-10-15. No limitations.  Wear tape for 2 more weeks.

## 2015-07-16 ENCOUNTER — Telehealth: Payer: Self-pay | Admitting: *Deleted

## 2015-07-16 NOTE — Telephone Encounter (Signed)
Routing to Dr Harrison for review 

## 2015-07-16 NOTE — Telephone Encounter (Signed)
Patient aware and unwilling to miss work

## 2015-07-16 NOTE — Telephone Encounter (Signed)
Patient came into the office today stating her toe on her left foot is still hurting and she is still working and wearing safety boots, she is still taping her toe as advise by Dr. Romeo AppleHarrison. Patient said her toe still feels like it is moving. Please advise [phone]

## 2015-07-16 NOTE — Telephone Encounter (Signed)
2 options   1 stay out of work 2 weeks   2. Continue tape and ibuprofen as is

## 2015-07-16 NOTE — Telephone Encounter (Signed)
Noted  

## 2015-08-05 NOTE — L&D Delivery Note (Signed)
25 y.o. [id number] at 5649w0d delivered a viable female infant in cephalic, LOA position, vigorously crying. No nuchal cord. Right anterior shoulder delivered with ease. 60 sec delayed cord clamping. Cord clamped x2 and cut. Bilobed placenta delivered spontaneously intact, with 3VC. Fundus firm on exam with massage and pitocin. Good hemostasis noted.  Laceration: NONE Suture: N/A Good hemostasis noted. EBL: 150 cc  Mom recovering in LDR. Baby vigorously crying, taken to NICU due to prematurity.    Apgars: 8/9 Weight: pending    Jen MowElizabeth Advit Trethewey, DO OB Fellow [facility] 04/23/2016, 5:46 AM

## 2015-08-27 ENCOUNTER — Encounter (HOSPITAL_COMMUNITY): Payer: Self-pay | Admitting: *Deleted

## 2015-08-27 ENCOUNTER — Emergency Department (HOSPITAL_COMMUNITY)
Admission: EM | Admit: 2015-08-27 | Discharge: 2015-08-27 | Disposition: A | Discharge status: Home / Self Care | Payer: Medicaid Other | Attending: Emergency Medicine | Admitting: Emergency Medicine

## 2015-08-27 DIAGNOSIS — F419 Anxiety disorder, unspecified: Secondary | ICD-10-CM | POA: Insufficient documentation

## 2015-08-27 DIAGNOSIS — F329 Major depressive disorder, single episode, unspecified: Secondary | ICD-10-CM | POA: Insufficient documentation

## 2015-08-27 DIAGNOSIS — F17210 Nicotine dependence, cigarettes, uncomplicated: Secondary | ICD-10-CM | POA: Diagnosis not present

## 2015-08-27 MED ORDER — CLONAZEPAM 1 MG PO TABS: 0.5000 mg | ORAL_TABLET | Freq: Three times a day (TID) | ORAL | Status: DC | PRN

## 2015-08-27 MED ORDER — CLONAZEPAM 0.5 MG PO TABS
1.0000 mg | ORAL_TABLET | Freq: Once | ORAL | Status: AC
  Administered 2015-08-27: 1 mg via ORAL
  Filled 2015-08-27: qty 2
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
## 2015-08-27 NOTE — Discharge Instructions (Signed)
Follow up with dr. Felecia Shelling and follow up with [facility] for your depression and anxiety

## 2015-09-24 ENCOUNTER — Encounter (HOSPITAL_COMMUNITY): Payer: Self-pay | Admitting: *Deleted

## 2015-09-24 ENCOUNTER — Emergency Department (HOSPITAL_COMMUNITY)
Admission: EM | Admit: 2015-09-24 | Discharge: 2015-09-24 | Disposition: A | Discharge status: Home / Self Care | Payer: Medicaid Other | Attending: Emergency Medicine | Admitting: Emergency Medicine

## 2015-09-24 ENCOUNTER — Emergency Department (HOSPITAL_COMMUNITY): Payer: Medicaid Other

## 2015-09-24 DIAGNOSIS — N63 Unspecified lump in unspecified breast: Secondary | ICD-10-CM

## 2015-09-24 DIAGNOSIS — R079 Chest pain, unspecified: Secondary | ICD-10-CM | POA: Insufficient documentation

## 2015-09-24 DIAGNOSIS — F17210 Nicotine dependence, cigarettes, uncomplicated: Secondary | ICD-10-CM | POA: Diagnosis not present

## 2015-09-24 DIAGNOSIS — R1084 Generalized abdominal pain: Secondary | ICD-10-CM | POA: Insufficient documentation

## 2015-09-24 DIAGNOSIS — F419 Anxiety disorder, unspecified: Secondary | ICD-10-CM | POA: Diagnosis not present

## 2015-09-24 LAB — CBC WITH DIFFERENTIAL/PLATELET
Basophils Absolute: 0 10*3/uL (ref 0.0–0.1)
Basophils Relative: 0 %
Eosinophils Absolute: 0.2 10*3/uL (ref 0.0–0.7)
Eosinophils Relative: 3 %
HEMATOCRIT: 37.8 % (ref 36.0–46.0)
HEMOGLOBIN: 13.1 g/dL (ref 12.0–15.0)
LYMPHS ABS: 2 10*3/uL (ref 0.7–4.0)
Lymphocytes Relative: 36 %
MCH: 32.9 pg (ref 26.0–34.0)
MCHC: 34.7 g/dL (ref 30.0–36.0)
MCV: 95 fL (ref 78.0–100.0)
MONO ABS: 0.6 10*3/uL (ref 0.1–1.0)
MONOS PCT: 10 %
NEUTROS ABS: 2.8 10*3/uL (ref 1.7–7.7)
NEUTROS PCT: 51 %
Platelets: 188 10*3/uL (ref 150–400)
RBC: 3.98 MIL/uL (ref 3.87–5.11)
RDW: 12.5 % (ref 11.5–15.5)
WBC: 5.6 10*3/uL (ref 4.0–10.5)

## 2015-09-24 LAB — URINALYSIS, ROUTINE W REFLEX MICROSCOPIC
Bilirubin Urine: NEGATIVE
GLUCOSE, UA: NEGATIVE mg/dL
Hgb urine dipstick: NEGATIVE
Ketones, ur: NEGATIVE mg/dL
LEUKOCYTES UA: NEGATIVE
NITRITE: NEGATIVE
PH: 6 (ref 5.0–8.0)
Protein, ur: NEGATIVE mg/dL

## 2015-09-24 LAB — COMPREHENSIVE METABOLIC PANEL
ALK PHOS: 58 U/L (ref 38–126)
ALT: 11 U/L — AB (ref 14–54)
ANION GAP: 5 (ref 5–15)
AST: 18 U/L (ref 15–41)
Albumin: 3.7 g/dL (ref 3.5–5.0)
BUN: 9 mg/dL (ref 6–20)
CALCIUM: 8.7 mg/dL — AB (ref 8.9–10.3)
CO2: 26 mmol/L (ref 22–32)
CREATININE: 0.8 mg/dL (ref 0.44–1.00)
Chloride: 108 mmol/L (ref 101–111)
GFR calc Af Amer: >60 mL/min (ref >60)
GFR calc non Af Amer: >60 mL/min (ref >60)
GLUCOSE: 89 mg/dL (ref 65–99)
Potassium: 3.7 mmol/L (ref 3.5–5.1)
Sodium: 139 mmol/L (ref 135–145)
Total Bilirubin: 0.4 mg/dL (ref 0.3–1.2)
Total Protein: 6.2 g/dL — ABNORMAL LOW (ref 6.5–8.1)

## 2015-09-24 LAB — LIPASE, BLOOD: Lipase: 17 U/L (ref 11–51)

## 2015-09-24 MED ORDER — HYDROXYZINE PAMOATE 25 MG PO CAPS: 25.0000 mg | ORAL_CAPSULE | Freq: Three times a day (TID) | ORAL | Status: DC | PRN

## 2015-09-24 NOTE — ED Notes (Signed)
Dr Fayrene Fearing reviewed EKG

## 2015-09-24 NOTE — ED Provider Notes (Signed)
CSN: [id number]     Arrival date & time 09/24/15  1535 History   First MD Initiated Contact with Patient 09/24/15 1705     Chief Complaint  Patient presents with  . Pain      HPI  Patient presents for valuation initially because of a breast lump. In the room she states that she is worrying about it. She is known about for several months. Was referred to have [place] mammogram last year and did not go. She continues to feel it. She awakens at night and worries about it. She states at times her chest and abdomen hurt and they do today. No cough or sputum production. No shortness of breath or exertional chest pain. No history of DVT or PE.  No cough or related symptoms of fever shortness of breath today. No right abdominal symptoms of nausea vomiting diarrhea dysuria or symptoms of pregnancy.  Past Medical History  Diagnosis Date  . Anxiety   . Depression    History reviewed. No pertinent past surgical history. No family history on file. Social History  Substance Use Topics  . Smoking status: Current Every Day Smoker    Types: Cigarettes  . Smokeless tobacco: None  . Alcohol Use: No   OB History    Gravida Para Term Preterm AB TAB SAB Ectopic Multiple Living   1              Review of Systems  Constitutional: Negative for fever, chills, diaphoresis, appetite change and fatigue.  HENT: Negative for mouth sores, sore throat and trouble swallowing.   Eyes: Negative for visual disturbance.  Respiratory: Negative for cough, chest tightness, shortness of breath and wheezing.   Cardiovascular: Positive for chest pain.  Gastrointestinal: Positive for abdominal pain. Negative for nausea, vomiting, diarrhea and abdominal distention.  Endocrine: Negative for polydipsia, polyphagia and polyuria.  Genitourinary: Negative for dysuria, frequency and hematuria.  Musculoskeletal: Negative for gait problem.  Skin: Negative for color change, pallor and rash.  Neurological: Negative for dizziness,  syncope, light-headedness and headaches.  Hematological: Does not bruise/bleed easily.  Psychiatric/Behavioral: Negative for behavioral problems and confusion. The patient is nervous/anxious.       Allergies  Tramadol and Vicodin  Home Medications   Prior to Admission medications   Medication Sig Start Date End Date Taking? Authorizing Provider  clonazePAM (KLONOPIN) 1 MG tablet Take 0.5 tablets (0.5 mg total) by mouth 3 (three) times daily as needed for anxiety. 08/27/15  Yes Bethann Berkshire, MD  hydrOXYzine (VISTARIL) 25 MG capsule Take 1 capsule (25 mg total) by mouth 3 (three) times daily as needed for itching or anxiety. 09/24/15   Rolland Porter, MD   BP 110/69 mmHg  Pulse 69  Temp(Src) 98.7 F (37.1 C) (Oral)  Resp 20  Ht  (1.803 m)  Wt 165 lb (74.844 kg)  BMI 23.02 kg/m2  SpO2 100%  LMP 08/15/2015 Physical Exam  Constitutional: She is oriented to person, place, and time. She appears well-developed and well-nourished. No distress.  HENT:  Head: Normocephalic.  Eyes: Conjunctivae are normal. Pupils are equal, round, and reactive to light. No scleral icterus.  Neck: Normal range of motion. Neck supple. No thyromegaly present.  Cardiovascular: Normal rate and regular rhythm.  Exam reveals no gallop and no friction rub.   No murmur heard. Pulmonary/Chest: Effort normal and breath sounds normal. No respiratory distress. She has no wheezes. She has no rales.    Abdominal: Soft. Bowel sounds are normal. She exhibits no  distension. There is no tenderness. There is no rebound.  Musculoskeletal: Normal range of motion.  Neurological: She is alert and oriented to person, place, and time.  Skin: Skin is warm and dry. No rash noted.  Psychiatric: She has a normal mood and affect. Her behavior is normal.    ED Course  Procedures (including critical care time) Labs Review Labs Reviewed  URINALYSIS, ROUTINE W REFLEX MICROSCOPIC (NOT AT [facility]) - Abnormal; Notable for the  following:    Specific Gravity, Urine >1.030 (*)    All other components within normal limits  COMPREHENSIVE METABOLIC PANEL - Abnormal; Notable for the following:    Calcium 8.7 (*)    Total Protein 6.2 (*)    ALT 11 (*)    All other components within normal limits  URINE CULTURE  CBC WITH DIFFERENTIAL/PLATELET  LIPASE, BLOOD    Imaging Review Dg Chest 2 View  09/24/2015  CLINICAL DATA:  Pt c/o sharp central chest pain and pressure x several days. Hx smoker, anxiety EXAM: CHEST - 2 VIEW COMPARISON:  06/24/2014 FINDINGS: Lungs are clear. Heart size and mediastinal contours are within normal limits. No effusion.  No pneumothorax. Visualized skeletal structures are unremarkable. IMPRESSION: No acute cardiopulmonary disease. Electronically Signed   By: Corlis Leak M.D.   On: 09/24/2015 18:41   I have personally reviewed and evaluated these images and lab results as part of my medical decision-making.   EKG Interpretation None      MDM   Final diagnoses:  Breast mass  Chest pain, unspecified chest pain type  Anxiety  Generalized abdominal pain    I had a long discussion with the patient. She has known about this lump for several months. She has declined previous mammograms. Given her referral to the [facility] breast [facility], as well as the [facility] breast imaging center. I told her no uncertain terms that this needs additional imaging to determine if it is benign or malignant. She has a reassuring exam and history is not suggestive of coronary artery disease, ACS, or acute chest or abdominal condition. My impression is that agreed over symptoms are related to anxiety. Refer her back her primary care physician. Atarax for her anxiety.    Rolland Porter, MD 09/24/15 [phone]

## 2015-09-24 NOTE — ED Notes (Signed)
Pt states she noticed a lump to right breast 3-4 days ago, making anxiety worse, per pt. Pt states she was supposed to have mammogram several months ago due to hx of breast ca in her family but never had it done. Pt also states to entire torso after finding the lump. States pain to entire back, mostly lower, ribs, abdomen and chest. States pressure to chest x 2 days.

## 2015-09-24 NOTE — ED Notes (Signed)
Called to treatment area x 2. No response. Pt not in WR

## 2015-09-24 NOTE — Discharge Instructions (Signed)
You have a mass in your breast that needs further testing to determine if it is benign or cancer. Call the Breast center at [facility] [phone], or at [facility] [phone] to make an appointment for a Mammogram.   Abdominal Pain, Adult Many things can cause abdominal pain. Usually, abdominal pain is not caused by a disease and will improve without treatment. It can often be observed and treated at home. Your health care provider will do a physical exam and possibly order blood tests and X-rays to help determine the seriousness of your pain. However, in many cases, more time must pass before a clear cause of the pain can be found. Before that point, your health care provider may not know if you need more testing or further treatment. HOME CARE INSTRUCTIONS Monitor your abdominal pain for any changes. The following actions may help to alleviate any discomfort you are experiencing:  Only take over-the-counter or prescription medicines as directed by your health care provider.  Do not take laxatives unless directed to do so by your health care provider.  Try a clear liquid diet (broth, tea, or water) as directed by your health care provider. Slowly move to a bland diet as tolerated. SEEK MEDICAL CARE IF:  You have unexplained abdominal pain.  You have abdominal pain associated with nausea or diarrhea.  You have pain when you urinate or have a bowel movement.  You experience abdominal pain that wakes you in the night.  You have abdominal pain that is worsened or improved by eating food.  You have abdominal pain that is worsened with eating fatty foods.  You have a fever. SEEK IMMEDIATE MEDICAL CARE IF:  Your pain does not go away within 2 hours.  You keep throwing up (vomiting).  Your pain is felt only in portions of the abdomen, such as the right side or the left lower portion of the abdomen.  You pass bloody or black tarry stools. MAKE SURE YOU:  Understand these  instructions.  Will watch your condition.  Will get help right away if you are not doing well or get worse.   This information is not intended to replace advice given to you by your health care provider. Make sure you discuss any questions you have with your health care provider.   Document Released: 04/30/2005 Document Revised: 04/11/2015 Document Reviewed: 03/30/2013 Elsevier Interactive Patient Education 2016 Elsevier Inc.  Chest Wall Pain Chest wall pain is pain in or around the bones and muscles of your chest. Sometimes, an injury causes this pain. Sometimes, the cause may not be known. This pain may take several weeks or longer to get better. HOME CARE INSTRUCTIONS  Pay attention to any changes in your symptoms. Take these actions to help with your pain:   Rest as told by your health care provider.   Avoid activities that cause pain. These include any activities that use your chest muscles or your abdominal and side muscles to lift heavy items.   If directed, apply ice to the painful area:  Put ice in a plastic bag.  Place a towel between your skin and the bag.  Leave the ice on for 20 minutes, 2-3 times per day.  Take over-the-counter and prescription medicines only as told by your health care provider.  Do not use tobacco products, including cigarettes, chewing tobacco, and e-cigarettes. If you need help quitting, ask your health care provider.  Keep all follow-up visits as told by your health care provider.  This is important. SEEK MEDICAL CARE IF:  You have a fever.  Your chest pain becomes worse.  You have new symptoms. SEEK IMMEDIATE MEDICAL CARE IF:  You have nausea or vomiting.  You feel sweaty or light-headed.  You have a cough with phlegm (sputum) or you cough up blood.  You develop shortness of breath.   This information is not intended to replace advice given to you by your health care provider. Make sure you discuss any questions you have with  your health care provider.   Document Released: 07/21/2005 Document Revised: 04/11/2015 Document Reviewed: 10/16/2014 Elsevier Interactive Patient Education 2016 Elsevier Inc.  Generalized Anxiety Disorder Generalized anxiety disorder (GAD) is a mental disorder. It interferes with life functions, including relationships, work, and school. GAD is different from normal anxiety, which everyone experiences at some point in their lives in response to specific life events and activities. Normal anxiety actually helps [place] prepare for and get through these life events and activities. Normal anxiety goes away after the event or activity is over.  GAD causes anxiety that is not necessarily related to specific events or activities. It also causes excess anxiety in proportion to specific events or activities. The anxiety associated with GAD is also difficult to control. GAD can vary from mild to severe. People with severe GAD can have intense waves of anxiety with physical symptoms (panic attacks).  SYMPTOMS The anxiety and worry associated with GAD are difficult to control. This anxiety and worry are related to many life events and activities and also occur more days than not for 6 months or longer. People with GAD also have three or more of the following symptoms (one or more in children):  Restlessness.   Fatigue.  Difficulty concentrating.   Irritability.  Muscle tension.  Difficulty sleeping or unsatisfying sleep. DIAGNOSIS GAD is diagnosed through an assessment by your health care provider. Your health care provider will ask you questions aboutyour mood,physical symptoms, and events in your life. Your health care provider may ask you about your medical history and use of alcohol or drugs, including prescription medicines. Your health care provider may also do a physical exam and blood tests. Certain medical conditions and the use of certain substances can cause symptoms similar to those  associated with GAD. Your health care provider may refer you to a mental health specialist for further evaluation. TREATMENT The following therapies are usually used to treat GAD:   Medication. Antidepressant medication usually is prescribed for long-term daily control. Antianxiety medicines may be added in severe cases, especially when panic attacks occur.   Talk therapy (psychotherapy). Certain types of talk therapy can be helpful in treating GAD by providing support, education, and guidance. A form of talk therapy called cognitive behavioral therapy can teach you healthy ways to think about and react to daily life events and activities.  Stress managementtechniques. These include yoga, meditation, and exercise and can be very helpful when they are practiced regularly. A mental health specialist can help determine which treatment is best for you. Some people see improvement with one therapy. However, other people require a combination of therapies.   This information is not intended to replace advice given to you by your health care provider. Make sure you discuss any questions you have with your health care provider.   Document Released: 11/15/2012 Document Revised: 08/11/2014 Document Reviewed: 11/15/2012 Elsevier Interactive Patient Education [employer]

## 2015-09-25 LAB — URINE CULTURE

## 2015-11-18 ENCOUNTER — Emergency Department
Admission: EM | Admit: 2015-11-18 | Discharge: 2015-11-18 | Disposition: A | Discharge status: Home / Self Care | Payer: Medicaid Other | Attending: Emergency Medicine | Admitting: Emergency Medicine

## 2015-11-18 ENCOUNTER — Emergency Department: Payer: Medicaid Other

## 2015-11-18 ENCOUNTER — Encounter: Payer: Self-pay | Admitting: Emergency Medicine

## 2015-11-18 DIAGNOSIS — S00432A Contusion of left ear, initial encounter: Secondary | ICD-10-CM | POA: Insufficient documentation

## 2015-11-18 DIAGNOSIS — R51 Headache: Secondary | ICD-10-CM | POA: Diagnosis not present

## 2015-11-18 DIAGNOSIS — Z331 Pregnant state, incidental: Secondary | ICD-10-CM | POA: Insufficient documentation

## 2015-11-18 DIAGNOSIS — F329 Major depressive disorder, single episode, unspecified: Secondary | ICD-10-CM | POA: Insufficient documentation

## 2015-11-18 DIAGNOSIS — Y999 Unspecified external cause status: Secondary | ICD-10-CM | POA: Diagnosis not present

## 2015-11-18 DIAGNOSIS — F17210 Nicotine dependence, cigarettes, uncomplicated: Secondary | ICD-10-CM | POA: Insufficient documentation

## 2015-11-18 DIAGNOSIS — Y929 Unspecified place or not applicable: Secondary | ICD-10-CM | POA: Diagnosis not present

## 2015-11-18 DIAGNOSIS — Y939 Activity, unspecified: Secondary | ICD-10-CM | POA: Diagnosis not present

## 2015-11-18 LAB — POCT PREGNANCY, URINE: Preg Test, Ur: POSITIVE — AB

## 2015-11-18 MED ORDER — ACETAMINOPHEN 500 MG PO TABS
ORAL_TABLET | ORAL | Status: AC
  Filled 2015-11-18: qty 2

## 2015-11-18 MED ORDER — ACETAMINOPHEN 325 MG PO TABS: 650.0000 mg | ORAL_TABLET | Freq: Once | ORAL | Status: DC

## 2015-11-18 MED ORDER — ACETAMINOPHEN 500 MG PO TABS
1000.0000 mg | ORAL_TABLET | Freq: Once | ORAL | Status: AC
  Administered 2015-11-18: 1000 mg via ORAL

## 2015-11-18 NOTE — Discharge Instructions (Signed)
General Assault  Assault includes any behavior or physical attack--whether it is on purpose or not--that results in injury to another person, damage to property, or both. This also includes assault that has not yet happened, but is planned to happen. Threats of assault may be physical, verbal, or written. They may be said or sent by:   Mail.   E-mail.   Text.   Social media.   Fax.  The threats may be direct, implied, or understood.  WHAT ARE THE DIFFERENT FORMS OF ASSAULT?  Forms of assault include:   Physically assaulting a person. This includes physical threats to inflict physical harm as well as:    Slapping.    Hitting.    Poking.    Kicking.    Punching.    Pushing.   Sexually assaulting a person. Sexual assault is any sexual activity that a person is forced, threatened, or coerced to participate in. It may or may not involve physical contact with the person who is assaulting you. You are sexually assaulted if you are forced to have sexual contact of any kind.   Damaging or destroying a person's assistive equipment, such as glasses, canes, or walkers.   Throwing or hitting objects.   Using or displaying a weapon to harm or threaten someone.   Using or displaying an object that appears to be a weapon in a threatening manner.   Using greater physical size or strength to intimidate someone.   Making intimidating or threatening gestures.   Bullying.   Hazing.   Using language that is intimidating, threatening, hostile, or abusive.   Stalking.   Restraining someone with force.  WHAT SHOULD I DO IF I EXPERIENCE ASSAULT?   Report assaults, threats, and stalking to the police. Call your local emergency services (911 in the [place] if you are in immediate danger or you need medical help.   You can work with a lawyer or an advocate to get legal protection against someone who has assaulted you or threatened you with assault. Protection includes restraining orders and private addresses. Crimes against  you, such as assault, can also be prosecuted through the courts. Laws will vary depending on where you live.     This information is not intended to replace advice given to you by your health care provider. Make sure you discuss any questions you have with your health care provider.     Document Released: 07/21/2005 Document Revised: 08/11/2014 Document Reviewed: 04/07/2014  Elsevier Interactive Patient Education 2016 Elsevier Inc.
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
## 2015-11-18 NOTE — ED Notes (Signed)
S.A.N.E. Nurse at bedside. Pt tearful at this time.  Pain is 10/10.
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
## 2015-11-18 NOTE — ED Notes (Signed)
Patient given specific follow up instructions to follow up with PCP and OB/GYN.

## 2015-12-11 ENCOUNTER — Emergency Department: Payer: Medicaid Other

## 2015-12-11 ENCOUNTER — Emergency Department
Admission: EM | Admit: 2015-12-11 | Discharge: 2015-12-11 | Disposition: A | Discharge status: Home / Self Care | Payer: Medicaid Other | Attending: Emergency Medicine | Admitting: Emergency Medicine

## 2015-12-11 DIAGNOSIS — F17210 Nicotine dependence, cigarettes, uncomplicated: Secondary | ICD-10-CM | POA: Diagnosis not present

## 2015-12-11 DIAGNOSIS — F329 Major depressive disorder, single episode, unspecified: Secondary | ICD-10-CM | POA: Diagnosis not present

## 2015-12-11 DIAGNOSIS — O26892 Other specified pregnancy related conditions, second trimester: Secondary | ICD-10-CM | POA: Insufficient documentation

## 2015-12-11 DIAGNOSIS — R1011 Right upper quadrant pain: Secondary | ICD-10-CM | POA: Diagnosis present

## 2015-12-11 DIAGNOSIS — O26899 Other specified pregnancy related conditions, unspecified trimester: Secondary | ICD-10-CM

## 2015-12-11 DIAGNOSIS — Z3A14 14 weeks gestation of pregnancy: Secondary | ICD-10-CM | POA: Insufficient documentation

## 2015-12-11 DIAGNOSIS — R109 Unspecified abdominal pain: Secondary | ICD-10-CM

## 2015-12-11 DIAGNOSIS — Z3492 Encounter for supervision of normal pregnancy, unspecified, second trimester: Secondary | ICD-10-CM

## 2015-12-11 LAB — CBC
HEMATOCRIT: 38 % (ref 35.0–47.0)
HEMOGLOBIN: 13 g/dL (ref 12.0–16.0)
MCH: 33 pg (ref 26.0–34.0)
MCHC: 34.2 g/dL (ref 32.0–36.0)
MCV: 96.6 fL (ref 80.0–100.0)
Platelets: 202 10*3/uL (ref 150–440)
RBC: 3.93 MIL/uL (ref 3.80–5.20)
RDW: 14.1 % (ref 11.5–14.5)
WBC: 12.3 10*3/uL — ABNORMAL HIGH (ref 3.6–11.0)

## 2015-12-11 LAB — LIPASE, BLOOD: Lipase: 13 U/L (ref 11–51)

## 2015-12-11 LAB — URINALYSIS COMPLETE WITH MICROSCOPIC (ARMC ONLY)
BACTERIA UA: NONE SEEN
BILIRUBIN URINE: NEGATIVE
Glucose, UA: NEGATIVE mg/dL
HGB URINE DIPSTICK: NEGATIVE
LEUKOCYTES UA: NEGATIVE
NITRITE: NEGATIVE
PH: 5 (ref 5.0–8.0)
PROTEIN: NEGATIVE mg/dL
SPECIFIC GRAVITY, URINE: 1.02 (ref 1.005–1.030)

## 2015-12-11 LAB — COMPREHENSIVE METABOLIC PANEL
ALBUMIN: 4.3 g/dL (ref 3.5–5.0)
ALT: 11 U/L — ABNORMAL LOW (ref 14–54)
ANION GAP: 10 (ref 5–15)
AST: 15 U/L (ref 15–41)
Alkaline Phosphatase: 51 U/L (ref 38–126)
BILIRUBIN TOTAL: 0.8 mg/dL (ref 0.3–1.2)
BUN: 10 mg/dL (ref 6–20)
CO2: 24 mmol/L (ref 22–32)
Calcium: 9.2 mg/dL (ref 8.9–10.3)
Chloride: 103 mmol/L (ref 101–111)
Creatinine, Ser: 0.66 mg/dL (ref 0.44–1.00)
GFR calc non Af Amer: >60 mL/min (ref >60)
GLUCOSE: 89 mg/dL (ref 65–99)
POTASSIUM: 3.6 mmol/L (ref 3.5–5.1)
SODIUM: 137 mmol/L (ref 135–145)
TOTAL PROTEIN: 7.5 g/dL (ref 6.5–8.1)

## 2015-12-11 LAB — POCT PREGNANCY, URINE: PREG TEST UR: POSITIVE — AB

## 2015-12-11 LAB — HCG, QUANTITATIVE, PREGNANCY: hCG, Beta Chain, Quant, S: 98516 m[IU]/mL — ABNORMAL HIGH (ref <5)

## 2015-12-11 MED ORDER — DIATRIZOATE MEGLUMINE & SODIUM 66-10 % PO SOLN: 15.0000 mL | Freq: Once | ORAL | Status: DC

## 2015-12-11 MED ORDER — PROMETHAZINE HCL 25 MG PO TABS
50.0000 mg | ORAL_TABLET | Freq: Once | ORAL | Status: AC
  Administered 2015-12-11: 50 mg via ORAL
  Filled 2015-12-11: qty 2

## 2015-12-11 MED ORDER — OXYCODONE-ACETAMINOPHEN 5-325 MG PO TABS
2.0000 | ORAL_TABLET | Freq: Once | ORAL | Status: AC
  Administered 2015-12-11: 2 via ORAL
  Filled 2015-12-11: qty 2

## 2015-12-11 MED ORDER — ONDANSETRON HCL 4 MG/2ML IJ SOLN
4.0000 mg | Freq: Once | INTRAMUSCULAR | Status: AC
  Administered 2015-12-11: 4 mg via INTRAVENOUS
  Filled 2015-12-11: qty 2

## 2015-12-11 MED ORDER — MORPHINE SULFATE (PF) 4 MG/ML IV SOLN
INTRAVENOUS | Status: AC
  Administered 2015-12-11: 4 mg via INTRAVENOUS
  Filled 2015-12-11: qty 1

## 2015-12-11 MED ORDER — MORPHINE SULFATE (PF) 4 MG/ML IV SOLN
4.0000 mg | Freq: Once | INTRAVENOUS | Status: AC
  Administered 2015-12-11: 4 mg via INTRAVENOUS
  Filled 2015-12-11: qty 1

## 2015-12-11 MED ORDER — OXYCODONE-ACETAMINOPHEN 5-325 MG PO TABS: 2.0000 | ORAL_TABLET | Freq: Four times a day (QID) | ORAL | Status: DC | PRN

## 2015-12-11 MED ORDER — MORPHINE SULFATE (PF) 4 MG/ML IV SOLN
4.0000 mg | Freq: Once | INTRAVENOUS | Status: AC
  Administered 2015-12-11: 4 mg via INTRAVENOUS

## 2015-12-11 MED ORDER — PROMETHAZINE HCL 25 MG PO TABS: 25.0000 mg | ORAL_TABLET | Freq: Four times a day (QID) | ORAL | Status: DC | PRN

## 2015-12-11 MED ORDER — SODIUM CHLORIDE 0.9 % IV BOLUS (SEPSIS)
1000.0000 mL | Freq: Once | INTRAVENOUS | Status: AC
  Administered 2015-12-11: 1000 mL via INTRAVENOUS

## 2015-12-11 NOTE — ED Provider Notes (Signed)
Patient presents to ER with abdominal pain of unclear etiology. Right upper quadrant and her right-sided ribs remain tender. Ultrasound and labs are grossly unremarkable. Pregnancy ultrasound reveals normal IUP. She has no right lower quadrant or right mid quadrant tenderness. Uterus is nontender. I given oral pain medicine and a limited supply and encouraged coming back tomorrow for recheck  Alexis FilbertJonathan E Williams, MD 12/11/15 1103

## 2015-12-11 NOTE — ED Notes (Signed)
Rounding done. Pt sitting upright talking with friend and eating bacon and eggs. Pt became tearful when nurse walked in room. Reinforced that we are waiting for HCG test results before a CT can be done. Pt verbalized understanding of plan.

## 2015-12-11 NOTE — ED Notes (Signed)
Patient to ER for right upper quadrant pain that started about an hour ago. Friend that is with the patient is wondering if she is having "gallbladder pain" as she just had her own gallbladder removed. Patient with nausea and vomiting x 10, maybe more. Has had  Vomiting throughout the day just not always accompanied with pain.

## 2015-12-11 NOTE — Discharge Instructions (Signed)
Second Trimester of Pregnancy °The second trimester is from week 13 through week 28, months 4 through 6. The second trimester is often a time when you feel your best. Your body has also adjusted to being pregnant, and you begin to feel better physically. Usually, morning sickness has lessened or quit completely, you may have more energy, and you may have an increase in appetite. The second trimester is also a time when the fetus is growing rapidly. At the end of the sixth month, the fetus is about 9 inches long and weighs about 1½ pounds. You will likely begin to feel the baby move (quickening) between 18 and 20 weeks of the pregnancy. °BODY CHANGES °Your body goes through many changes during pregnancy. The changes vary from woman to woman.  °· Your weight will continue to increase. You will notice your lower abdomen bulging out. °· You may begin to get stretch marks on your hips, abdomen, and breasts. °· You may develop headaches that can be relieved by medicines approved by your health care provider. °· You may urinate more often because the fetus is pressing on your bladder. °· You may develop or continue to have heartburn as a result of your pregnancy. °· You may develop constipation because certain hormones are causing the muscles that push waste through your intestines to slow down. °· You may develop hemorrhoids or swollen, bulging veins (varicose veins). °· You may have back pain because of the weight gain and pregnancy hormones relaxing your joints between the bones in your pelvis and as a result of a shift in weight and the muscles that support your balance. °· Your breasts will continue to grow and be tender. °· Your gums may bleed and may be sensitive to brushing and flossing. °· Dark spots or blotches (chloasma, mask of pregnancy) may develop on your face. This will likely fade after the baby is born. °· A dark line from your belly button to the pubic area (linea nigra) may appear. This will likely fade  after the baby is born. °· You may have changes in your hair. These can include thickening of your hair, rapid growth, and changes in texture. Some women also have hair loss during or after pregnancy, or hair that feels dry or thin. Your hair will most likely return to normal after your baby is born. °WHAT TO EXPECT AT YOUR PRENATAL VISITS °During a routine prenatal visit: °· You will be weighed to make sure you and the fetus are growing normally. °· Your blood pressure will be taken. °· Your abdomen will be measured to track your baby's growth. °· The fetal heartbeat will be listened to. °· Any test results from the previous visit will be discussed. °Your health care provider may ask you: °· How you are feeling. °· If you are feeling the baby move. °· If you have had any abnormal symptoms, such as leaking fluid, bleeding, severe headaches, or abdominal cramping. °· If you are using any tobacco products, including cigarettes, chewing tobacco, and electronic cigarettes. °· If you have any questions. °Other tests that may be performed during your second trimester include: °· Blood tests that check for: °¨ Low iron levels (anemia). °¨ Gestational diabetes (between 24 and 28 weeks). °¨ Rh antibodies. °· Urine tests to check for infections, diabetes, or protein in the urine. °· An ultrasound to confirm the proper growth and development of the baby. °· An amniocentesis to check for possible genetic problems. °· Fetal screens for spina bifida   and Down syndrome. °· HIV (human immunodeficiency virus) testing. Routine prenatal testing includes screening for HIV, unless you choose not to have this test. °HOME CARE INSTRUCTIONS  °· Avoid all smoking, herbs, alcohol, and unprescribed drugs. These chemicals affect the formation and growth of the baby. °· Do not use any tobacco products, including cigarettes, chewing tobacco, and electronic cigarettes. If you need help quitting, ask your health care provider. You may receive  counseling support and other resources to help you quit. °· Follow your health care provider's instructions regarding medicine use. There are medicines that are either safe or unsafe to take during pregnancy. °· Exercise only as directed by your health care provider. Experiencing uterine cramps is a good sign to stop exercising. °· Continue to eat regular, healthy meals. °· Wear a good support bra for breast tenderness. °· Do not use hot tubs, steam rooms, or saunas. °· Wear your seat belt at all times when driving. °· Avoid raw meat, uncooked cheese, cat litter boxes, and soil used by cats. These carry germs that can cause birth defects in the baby. °· Take your prenatal vitamins. °· Take 1500-2000 mg of calcium daily starting at the 20th week of pregnancy until you deliver your baby. °· Try taking a stool softener (if your health care provider approves) if you develop constipation. Eat more high-fiber foods, such as fresh vegetables or fruit and whole grains. Drink plenty of fluids to keep your urine clear or pale yellow. °· Take warm sitz baths to soothe any pain or discomfort caused by hemorrhoids. Use hemorrhoid cream if your health care provider approves. °· If you develop varicose veins, wear support hose. Elevate your feet for 15 minutes, 3-4 times a day. Limit salt in your diet. °· Avoid heavy lifting, wear low heel shoes, and practice good posture. °· Rest with your legs elevated if you have leg cramps or low back pain. °· Visit your dentist if you have not gone yet during your pregnancy. Use a soft toothbrush to brush your teeth and be gentle when you floss. °· A sexual relationship may be continued unless your health care provider directs you otherwise. °· Continue to go to all your prenatal visits as directed by your health care provider. °SEEK MEDICAL CARE IF:  °· You have dizziness. °· You have mild pelvic cramps, pelvic pressure, or nagging pain in the abdominal area. °· You have persistent nausea,  vomiting, or diarrhea. °· You have a bad smelling vaginal discharge. °· You have pain with urination. °SEEK IMMEDIATE MEDICAL CARE IF:  °· You have a fever. °· You are leaking fluid from your vagina. °· You have spotting or bleeding from your vagina. °· You have severe abdominal cramping or pain. °· You have rapid weight gain or loss. °· You have shortness of breath with chest pain. °· You notice sudden or extreme swelling of your face, hands, ankles, feet, or legs. °· You have not felt your baby move in over an hour. °· You have severe headaches that do not go away with medicine. °· You have vision changes. °  °This information is not intended to replace advice given to you by your health care provider. Make sure you discuss any questions you have with your health care provider. °  °Document Released: 07/15/2001 Document Revised: 08/11/2014 Document Reviewed: 09/21/2012 °Elsevier Interactive Patient Education ©2016 [employer] °Abdominal Pain During Pregnancy °Abdominal pain is common in pregnancy. Most of the time, it does not cause harm. There are many   causes of abdominal pain. Some causes are more serious than others. Some of the causes of abdominal pain in pregnancy are easily diagnosed. Occasionally, the diagnosis takes time to understand. Other times, the cause is not determined. Abdominal pain can be a sign that something is very wrong with the pregnancy, or the pain may have nothing to do with the pregnancy at all. For this reason, always tell your health care provider if you have any abdominal discomfort. °HOME CARE INSTRUCTIONS  °Monitor your abdominal pain for any changes. The following actions may help to alleviate any discomfort you are experiencing: °· Do not have sexual intercourse or put anything in your vagina until your symptoms go away completely. °· Get plenty of rest until your pain improves. °· Drink clear fluids if you feel nauseous. Avoid solid food as long as you are uncomfortable or  nauseous. °· Only take over-the-counter or prescription medicine as directed by your health care provider. °· Keep all follow-up appointments with your health care provider. °SEEK IMMEDIATE MEDICAL CARE IF: °· You are bleeding, leaking fluid, or passing tissue from the vagina. °· You have increasing pain or cramping. °· You have persistent vomiting. °· You have painful or bloody urination. °· You have a fever. °· You notice a decrease in your baby's movements. °· You have extreme weakness or feel faint. °· You have shortness of breath, with or without abdominal pain. °· You develop a severe headache with abdominal pain. °· You have abnormal vaginal discharge with abdominal pain. °· You have persistent diarrhea. °· You have abdominal pain that continues even after rest, or gets worse. °MAKE SURE YOU:  °· Understand these instructions. °· Will watch your condition. °· Will get help right away if you are not doing well or get worse. °  °This information is not intended to replace advice given to you by your health care provider. Make sure you discuss any questions you have with your health care provider. °  °Document Released: 07/21/2005 Document Revised: 05/11/2013 Document Reviewed: 02/17/2013 °Elsevier Interactive Patient Education ©2016 [employer] Inc[employer] ° °

## 2015-12-11 NOTE — ED Notes (Addendum)
Pt and friend in room upset about wait time for blood test results and wanting to know why pt is having pain. MD at bedside explaining results received so far and plan of care. Emotional support provided.

## 2015-12-11 NOTE — ED Notes (Signed)
Pt transported to US via stretcher.  
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
## 2015-12-11 NOTE — ED Notes (Signed)
Friend of pt states that RUQ pain that feels like when friend had her gallbladder removed. Pt states pain began around 0230. Pt clutching R side of abdomen. Pt states N&V, denies diarrhea. Denies fever.

## 2015-12-30 ENCOUNTER — Encounter: Payer: Self-pay | Admitting: Emergency Medicine

## 2015-12-30 ENCOUNTER — Emergency Department
Admission: EM | Admit: 2015-12-30 | Discharge: 2015-12-30 | Disposition: A | Discharge status: Home / Self Care | Payer: Medicaid Other | Attending: Emergency Medicine | Admitting: Emergency Medicine

## 2015-12-30 DIAGNOSIS — Z79899 Other long term (current) drug therapy: Secondary | ICD-10-CM | POA: Insufficient documentation

## 2015-12-30 DIAGNOSIS — F17210 Nicotine dependence, cigarettes, uncomplicated: Secondary | ICD-10-CM | POA: Insufficient documentation

## 2015-12-30 DIAGNOSIS — Z3A13 13 weeks gestation of pregnancy: Secondary | ICD-10-CM | POA: Insufficient documentation

## 2015-12-30 DIAGNOSIS — F329 Major depressive disorder, single episode, unspecified: Secondary | ICD-10-CM | POA: Insufficient documentation

## 2015-12-30 DIAGNOSIS — B349 Viral infection, unspecified: Secondary | ICD-10-CM | POA: Diagnosis not present

## 2015-12-30 DIAGNOSIS — F1123 Opioid dependence with withdrawal: Secondary | ICD-10-CM | POA: Insufficient documentation

## 2015-12-30 DIAGNOSIS — O99511 Diseases of the respiratory system complicating pregnancy, first trimester: Secondary | ICD-10-CM | POA: Diagnosis not present

## 2015-12-30 DIAGNOSIS — Z3492 Encounter for supervision of normal pregnancy, unspecified, second trimester: Secondary | ICD-10-CM

## 2015-12-30 DIAGNOSIS — F1120 Opioid dependence, uncomplicated: Secondary | ICD-10-CM

## 2015-12-30 DIAGNOSIS — F1193 Opioid use, unspecified with withdrawal: Secondary | ICD-10-CM

## 2015-12-30 DIAGNOSIS — R05 Cough: Secondary | ICD-10-CM | POA: Diagnosis present

## 2015-12-30 MED ORDER — ONDANSETRON HCL 4 MG PO TABS: ORAL_TABLET | ORAL | Status: DC

## 2015-12-30 MED ORDER — DICYCLOMINE HCL 10 MG PO CAPS: 10.0000 mg | ORAL_CAPSULE | Freq: Three times a day (TID) | ORAL | Status: DC | PRN

## 2015-12-30 NOTE — [facility] Counselor (Signed)
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  She reports she  last used "a couple of days ago".  Pt endorses depression but denies suicidal thoughts.  Pt  is about [redacted] weeks pregnant and has nowhere to live but thinks she can stay with a friend.  This Clinical research associatewriter spoke with Patsy, Intake RN at [facility], who reports pt does not meet criteria due to being pregnant.  Also [facility] Ray[facility]anne, [facility], who reports that they are at capacity.  Provided patient with a list of resources for substance abuse intensive outpatient services.

## 2015-12-30 NOTE — ED Notes (Signed)
No orders given by Dr York CeriseForbach

## 2015-12-30 NOTE — ED Notes (Addendum)
Pt reports several days of sinus congestion and cough; also say she's going through withdrawal from heroin-hot sweats, loss of appetite, anxiety; very emotional; last used "a couple of days ago";  depressed; denies suicidal thoughts; pt adds she is about [redacted] weeks pregnant and has nowhere to live; also took last Klonopin yesterday

## 2015-12-30 NOTE — Discharge Instructions (Signed)
As we discussed, we cannot admit you to [facility] for detox.  [facility] has rooms available but they currently do not.  We recommend that he follow up with the [facility] for prenatal care and start taking prenatal vitamins as soon as possible.  Please also follow up at [facility] for mental health and substance abuse services.  Use the prescribed medications as needed and as indicated, and you may also use Tylenol according to the label instructions as needed for pain.   Opioid Use Disorder Opioid use disorder is a mental disorder. It is the continued nonmedical use of opioids in spite of risks to health and well-being. Misused opioids include the street drug heroin. They also include pain medicines such as morphine, hydrocodone, oxycodone, and fentanyl. Opioids are very addictive. People who misuse opioids get an exaggerated feeling of well-being. Opioid use disorder often disrupts activities at home, work, or school. It may cause mental or physical problems.  A family history of opioid use disorder puts you at higher risk of it. People with opioid use disorder often misuse other drugs or have mental illness such as depression, posttraumatic stress disorder, or antisocial personality disorder. They also are at risk of suicide and death from overdose. SIGNS AND SYMPTOMS  Signs and symptoms of opioid use disorder include:  Use of opioids in larger amounts or over a longer period than intended.  Unsuccessful attempts to cut down or control opioid use.  A lot of time spent obtaining, using, or recovering from the effects of opioids.  A strong desire or urge to use opioids (craving).  Continued use of opioids in spite of major problems at work, school, or home because of use.  Continued use of opioids in spite of relationship problems because of use.  Giving up or cutting down on important life activities because of opioid use.  Use of opioids over  and over in situations when it is physically hazardous, such as driving a car.  Continued use of opioids in spite of a physical problem that is likely related to use. Physical problems can include:  Severe constipation.  Poor nutrition.  Infertility.  Tuberculosis.  Aspiration pneumonia.  Infections such as human immunodeficiency virus (HIV) and hepatitis (from injecting opioids).  Continued use of opioids in spite of a mental problem that is likely related to use. Mental problems can include:  Depression.  Anxiety.  Hallucinations.  Sleep problems.  Loss of sexual function.  Need to use more and more opioids to get the same effect, or lessened effect over time with use of the same amount (tolerance).  Having withdrawal symptoms when opioid use is stopped, or using opioids to reduce or avoid withdrawal symptoms. Withdrawal symptoms include:  Depressed, anxious, or irritable mood.  Nausea, vomiting, diarrhea, or intestinal cramping.  Muscle aches or spasms.  Excessive tearing or runny nose.  Dilated pupils, sweating, or hairs standing on end.  Yawning.  Fever, raised blood pressure, or fast pulse.  Restlessness or trouble sleeping. This does not apply to people taking opioids for medical reasons only. DIAGNOSIS Opioid use disorder is diagnosed by your health care provider. You may be asked questions about your opioid use and and how it affects your life. A physical exam may be done. A drug screen may be ordered. You may be referred to a mental health professional. The diagnosis of opioid use disorder requires at least two symptoms within 12 months. The type of opioid use disorder  you have depends on the number of signs and symptoms you have. The type may be:  Mild. Two or three signs and symptoms.   Moderate. Four or five signs and symptoms.   Severe. Six or more signs and symptoms. TREATMENT  Treatment is usually provided by mental health professionals with  training in substance use disorders.The following options are available:  Detoxification.This is the first step in treatment for withdrawal. It is medically supervised withdrawal with the use of medicines. These medicines lessen withdrawal symptoms. They also raise the chance of becoming opioid free.  Counseling, also known as talk therapy. Talk therapy addresses the reasons you use opioids. It also addresses ways to keep you from using again (relapse). The goals of talk therapy are to avoid relapse by:  Identifying and avoiding triggers for use.  Finding healthy ways to cope with stress.  Learning how to handle cravings.  Support groups. Support groups provide emotional support, advice, and guidance.  A medicine that blocks opioid receptors in your brain. This medicine can reduce opioid cravings that lead to relapse. This medicine also blocks the desired opioid effect when relapse occurs.  Opioids that are taken by mouth in place of the misused opioid (opioid maintenance treatment). These medicines satisfy cravings but are safer than commonly misused opioids. This often is the best option for people who continue to relapse with other treatments. HOME CARE INSTRUCTIONS   Take medicines only as directed by your health care provider.  Check with your health care provider before starting new medicines.  Keep all follow-up visits as directed by your health care provider. SEEK MEDICAL CARE IF:  You are not able to take your medicines as directed.  Your symptoms get worse. SEEK IMMEDIATE MEDICAL CARE IF:  You have serious thoughts about hurting yourself or others.  You may have taken an overdose of opioids. FOR MORE INFORMATION  National Institute on Drug Abuse: http://www.price-smith.com/  Substance Abuse and Mental Health Services Administration: SkateOasis.com.pt   This information is not intended to replace advice given to you by your health care provider. Make sure you discuss any  questions you have with your health care provider.   Document Released: 05/18/2007 Document Revised: 08/11/2014 Document Reviewed: 08/03/2013 Elsevier Interactive Patient Education 2016 [employer].  Second Trimester of Pregnancy The second trimester is from week 13 through week 28, months 4 through 6. The second trimester is often a time when you feel your best. Your body has also adjusted to being pregnant, and you begin to feel better physically. Usually, morning sickness has lessened or quit completely, you may have more energy, and you may have an increase in appetite. The second trimester is also a time when the fetus is growing rapidly. At the end of the sixth month, the fetus is about 9 inches long and weighs about 1 pounds. You will likely begin to feel the baby move (quickening) between 18 and 20 weeks of the pregnancy. BODY CHANGES Your body goes through many changes during pregnancy. The changes vary from woman to woman.   Your weight will continue to increase. You will notice your lower abdomen bulging out.  You may begin to get stretch marks on your hips, abdomen, and breasts.  You may develop headaches that can be relieved by medicines approved by your health care provider.  You may urinate more often because the fetus is pressing on your bladder.  You may develop or continue to have heartburn as a result of your pregnancy.  You may develop constipation because certain hormones are causing the muscles that push waste through your intestines to slow down.  You may develop hemorrhoids or swollen, bulging veins (varicose veins).  You may have back pain because of the weight gain and pregnancy hormones relaxing your joints between the bones in your pelvis and as a result of a shift in weight and the muscles that support your balance.  Your breasts will continue to grow and be tender.  Your gums may bleed and may be sensitive to brushing and flossing.  Dark spots or  blotches (chloasma, mask of pregnancy) may develop on your face. This will likely fade after the baby is born.  A dark line from your belly button to the pubic area (linea nigra) may appear. This will likely fade after the baby is born.  You may have changes in your hair. These can include thickening of your hair, rapid growth, and changes in texture. Some women also have hair loss during or after pregnancy, or hair that feels dry or thin. Your hair will most likely return to normal after your baby is born. WHAT TO EXPECT AT YOUR PRENATAL VISITS During a routine prenatal visit:  You will be weighed to make sure you and the fetus are growing normally.  Your blood pressure will be taken.  Your abdomen will be measured to track your baby's growth.  The fetal heartbeat will be listened to.  Any test results from the previous visit will be discussed. Your health care provider may ask you:  How you are feeling.  If you are feeling the baby move.  If you have had any abnormal symptoms, such as leaking fluid, bleeding, severe headaches, or abdominal cramping.  If you are using any tobacco products, including cigarettes, chewing tobacco, and electronic cigarettes.  If you have any questions. Other tests that may be performed during your second trimester include:  Blood tests that check for:  Low iron levels (anemia).  Gestational diabetes (between 24 and 28 weeks).  Rh antibodies.  Urine tests to check for infections, diabetes, or protein in the urine.  An ultrasound to confirm the proper growth and development of the baby.  An amniocentesis to check for possible genetic problems.  Fetal screens for spina bifida and Down syndrome.  HIV (human immunodeficiency virus) testing. Routine prenatal testing includes screening for HIV, unless you choose not to have this test. HOME CARE INSTRUCTIONS   Avoid all smoking, herbs, alcohol, and unprescribed drugs. These chemicals affect the  formation and growth of the baby.  Do not use any tobacco products, including cigarettes, chewing tobacco, and electronic cigarettes. If you need help quitting, ask your health care provider. You may receive counseling support and other resources to help you quit.  Follow your health care provider's instructions regarding medicine use. There are medicines that are either safe or unsafe to take during pregnancy.  Exercise only as directed by your health care provider. Experiencing uterine cramps is a good sign to stop exercising.  Continue to eat regular, healthy meals.  Wear a good support bra for breast tenderness.  Do not use hot tubs, steam rooms, or saunas.  Wear your seat belt at all times when driving.  Avoid raw meat, uncooked cheese, cat litter boxes, and soil used by cats. These carry germs that can cause birth defects in the baby.  Take your prenatal vitamins.  Take 1500-2000 mg of calcium daily starting at the 20th week of pregnancy until you deliver  your baby.  Try taking a stool softener (if your health care provider approves) if you develop constipation. Eat more high-fiber foods, such as fresh vegetables or fruit and whole grains. Drink plenty of fluids to keep your urine clear or pale yellow.  Take warm sitz baths to soothe any pain or discomfort caused by hemorrhoids. Use hemorrhoid cream if your health care provider approves.  If you develop varicose veins, wear support hose. Elevate your feet for 15 minutes, 3-4 times a day. Limit salt in your diet.  Avoid heavy lifting, wear low heel shoes, and practice good posture.  Rest with your legs elevated if you have leg cramps or low back pain.  Visit your dentist if you have not gone yet during your pregnancy. Use a soft toothbrush to brush your teeth and be gentle when you floss.  A sexual relationship may be continued unless your health care provider directs you otherwise.  Continue to go to all your prenatal visits  as directed by your health care provider. SEEK MEDICAL CARE IF:   You have dizziness.  You have mild pelvic cramps, pelvic pressure, or nagging pain in the abdominal area.  You have persistent nausea, vomiting, or diarrhea.  You have a bad smelling vaginal discharge.  You have pain with urination. SEEK IMMEDIATE MEDICAL CARE IF:   You have a fever.  You are leaking fluid from your vagina.  You have spotting or bleeding from your vagina.  You have severe abdominal cramping or pain.  You have rapid weight gain or loss.  You have shortness of breath with chest pain.  You notice sudden or extreme swelling of your face, hands, ankles, feet, or legs.  You have not felt your baby move in over an hour.  You have severe headaches that do not go away with medicine.  You have vision changes.   This information is not intended to replace advice given to you by your health care provider. Make sure you discuss any questions you have with your health care provider.   Document Released: 07/15/2001 Document Revised: 08/11/2014 Document Reviewed: 09/21/2012 Elsevier Interactive Patient Education [employer]

## 2015-12-30 NOTE — ED Notes (Signed)
Patient states that she feels like she has a upper respiratory infection. Patient states that she is also withdrawing from heroin. Patient states that she last used 2-3 days ago. Patient states that she has been using for 2 years. Denies any previous rehab treatment.
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
## 2016-01-12 ENCOUNTER — Inpatient Hospital Stay (HOSPITAL_COMMUNITY)
Admission: AD | Admit: 2016-01-12 | Discharge: 2016-01-12 | Disposition: A | Discharge status: Home / Self Care | Payer: Medicaid Other | Source: Ambulatory Visit | Attending: Family Medicine | Admitting: Family Medicine

## 2016-01-12 ENCOUNTER — Encounter (HOSPITAL_COMMUNITY): Payer: Self-pay

## 2016-01-12 DIAGNOSIS — O99332 Smoking (tobacco) complicating pregnancy, second trimester: Secondary | ICD-10-CM | POA: Diagnosis not present

## 2016-01-12 DIAGNOSIS — F419 Anxiety disorder, unspecified: Secondary | ICD-10-CM | POA: Diagnosis not present

## 2016-01-12 DIAGNOSIS — O99342 Other mental disorders complicating pregnancy, second trimester: Secondary | ICD-10-CM | POA: Diagnosis not present

## 2016-01-12 DIAGNOSIS — Z3A19 19 weeks gestation of pregnancy: Secondary | ICD-10-CM | POA: Insufficient documentation

## 2016-01-12 DIAGNOSIS — F329 Major depressive disorder, single episode, unspecified: Secondary | ICD-10-CM | POA: Insufficient documentation

## 2016-01-12 DIAGNOSIS — N760 Acute vaginitis: Secondary | ICD-10-CM

## 2016-01-12 DIAGNOSIS — R109 Unspecified abdominal pain: Secondary | ICD-10-CM

## 2016-01-12 DIAGNOSIS — O9989 Other specified diseases and conditions complicating pregnancy, childbirth and the puerperium: Secondary | ICD-10-CM | POA: Diagnosis not present

## 2016-01-12 DIAGNOSIS — O26892 Other specified pregnancy related conditions, second trimester: Secondary | ICD-10-CM | POA: Diagnosis not present

## 2016-01-12 DIAGNOSIS — N898 Other specified noninflammatory disorders of vagina: Secondary | ICD-10-CM | POA: Diagnosis present

## 2016-01-12 DIAGNOSIS — F1190 Opioid use, unspecified, uncomplicated: Secondary | ICD-10-CM

## 2016-01-12 DIAGNOSIS — O99322 Drug use complicating pregnancy, second trimester: Secondary | ICD-10-CM | POA: Diagnosis not present

## 2016-01-12 DIAGNOSIS — B9689 Other specified bacterial agents as the cause of diseases classified elsewhere: Secondary | ICD-10-CM

## 2016-01-12 LAB — WET PREP, GENITAL
SPERM: NONE SEEN
TRICH WET PREP: NONE SEEN
YEAST WET PREP: NONE SEEN

## 2016-01-12 LAB — RAPID URINE DRUG SCREEN, HOSP PERFORMED
AMPHETAMINES: NOT DETECTED
Barbiturates: NOT DETECTED
Benzodiazepines: NOT DETECTED
Cocaine: POSITIVE — AB
OPIATES: POSITIVE — AB
TETRAHYDROCANNABINOL: POSITIVE — AB

## 2016-01-12 LAB — URINALYSIS, ROUTINE W REFLEX MICROSCOPIC
BILIRUBIN URINE: NEGATIVE
GLUCOSE, UA: NEGATIVE mg/dL
HGB URINE DIPSTICK: NEGATIVE
Ketones, ur: NEGATIVE mg/dL
Nitrite: NEGATIVE
Protein, ur: NEGATIVE mg/dL
SPECIFIC GRAVITY, URINE: 1.025 (ref 1.005–1.030)
pH: 6 (ref 5.0–8.0)

## 2016-01-12 LAB — URINE MICROSCOPIC-ADD ON

## 2016-01-12 LAB — AMNISURE RUPTURE OF MEMBRANE (ROM) NOT AT ARMC: AMNISURE: NEGATIVE

## 2016-01-12 MED ORDER — METRONIDAZOLE 500 MG PO TABS: 500.0000 mg | ORAL_TABLET | Freq: Three times a day (TID) | ORAL | Status: DC

## 2016-01-12 NOTE — Discharge Instructions (Signed)

## 2016-01-12 NOTE — MAU Note (Signed)
Pt states she has clear fluid coming from her vagina on and off in the last 2 days. Pt denies vaginal bleeding. Pt states she has abdominal cramping occasionally.

## 2016-01-12 NOTE — MAU Provider Note (Signed)
History   G3P1102 @ 19.3 wks in with c/o clear vag discharge. States last drug use 1 week ago when she used heroin. Yet to start prenatal care.  CSN: [id number]  Arrival date & time 01/12/16  1709   First Provider Initiated Contact with Patient 01/12/16 1737      Chief Complaint  Patient presents with  . Abdominal Cramping    HPI  Past Medical History  Diagnosis Date  . Anxiety   . Depression     History reviewed. No pertinent past surgical history.  History reviewed. No pertinent family history.  Social History  Substance Use Topics  . Smoking status: Current Some Day Smoker -- 0.00 packs/day    Types: Cigarettes  . Smokeless tobacco: None  . Alcohol Use: No    OB History    Gravida Para Term Preterm AB TAB SAB Ectopic Multiple Living   3 2 1 1      2       Review of Systems  Constitutional: Negative.   HENT: Negative.   Eyes: Negative.   Respiratory: Negative.   Cardiovascular: Negative.   Gastrointestinal: Positive for abdominal pain.  Endocrine: Negative.   Genitourinary: Positive for vaginal discharge.  Musculoskeletal: Negative.   Skin: Negative.   Allergic/Immunologic: Negative.   Neurological: Negative.   Hematological: Negative.   Psychiatric/Behavioral: Negative.     Allergies  Tramadol and Vicodin  Home Medications  No current outpatient prescriptions on file.  BP 103/65 mmHg  Pulse 83  Temp(Src) 97.8 F (36.6 C) (Oral)  Resp 18  LMP 08/15/2015 (Approximate)  Physical Exam  Constitutional: She is oriented to person, place, and time. She appears well-developed and well-nourished.  HENT:  Head: Normocephalic.  Eyes: Pupils are equal, round, and reactive to light.  Neck: Normal range of motion.  Cardiovascular: Normal rate, regular rhythm, normal heart sounds and intact distal pulses.   Pulmonary/Chest: Effort normal and breath sounds normal.  Abdominal: Soft. Bowel sounds are normal.  Genitourinary:  Poor hygiene   Musculoskeletal: Normal range of motion.  Neurological: She is alert and oriented to person, place, and time. She has normal reflexes.  Skin: Skin is warm and dry.  Psychiatric: She has a normal mood and affect. Her behavior is normal. Judgment and thought content normal.    MAU Course  Procedures (including critical care time)  Labs Reviewed  WET PREP, GENITAL  URINALYSIS, ROUTINE W REFLEX MICROSCOPIC (NOT AT [facility])  URINE RAPID DRUG SCREEN, HOSP PERFORMED  AMNISURE RUPTURE OF MEMBRANE (ROM) NOT AT [facility]  GC/CHLAMYDIA PROBE AMP ([place]) NOT AT [facility]   No results found.   No diagnosis found.    MDM  Wet prep BV , GC, Chla, amnisure done and neg. Encouraged not to use drugs and to start prenatal care. D/C home

## 2016-01-14 LAB — GC/CHLAMYDIA PROBE AMP (~~LOC~~) NOT AT ARMC
CHLAMYDIA, DNA PROBE: NEGATIVE
NEISSERIA GONORRHEA: NEGATIVE

## 2016-02-29 ENCOUNTER — Emergency Department
Admission: EM | Admit: 2016-02-29 | Discharge: 2016-02-29 | Disposition: A | Discharge status: Home / Self Care | Payer: Medicaid Other

## 2016-02-29 NOTE — ED Triage Notes (Signed)
Call X 3 at different times for triage. No answer.

## 2016-04-17 ENCOUNTER — Inpatient Hospital Stay (HOSPITAL_COMMUNITY): Payer: Medicaid Other

## 2016-04-17 ENCOUNTER — Encounter (HOSPITAL_COMMUNITY): Payer: Self-pay

## 2016-04-17 ENCOUNTER — Inpatient Hospital Stay
Admission: EM | Admit: 2016-04-17 | Discharge: 2016-04-17 | Disposition: A | Discharge status: Home / Self Care | Payer: Medicaid Other | Attending: Obstetrics and Gynecology | Admitting: Obstetrics and Gynecology

## 2016-04-17 ENCOUNTER — Inpatient Hospital Stay (HOSPITAL_COMMUNITY)
Admission: AD | Admit: 2016-04-17 | Discharge: 2016-04-25 | DRG: 775 | Disposition: A | Discharge status: Home / Self Care | Payer: Medicaid Other | Source: Ambulatory Visit | Attending: Family Medicine | Admitting: Family Medicine

## 2016-04-17 DIAGNOSIS — F419 Anxiety disorder, unspecified: Secondary | ICD-10-CM | POA: Insufficient documentation

## 2016-04-17 DIAGNOSIS — Z3A33 33 weeks gestation of pregnancy: Secondary | ICD-10-CM

## 2016-04-17 DIAGNOSIS — O99324 Drug use complicating childbirth: Secondary | ICD-10-CM | POA: Diagnosis present

## 2016-04-17 DIAGNOSIS — O42919 Preterm premature rupture of membranes, unspecified as to length of time between rupture and onset of labor, unspecified trimester: Secondary | ICD-10-CM | POA: Diagnosis not present

## 2016-04-17 DIAGNOSIS — O42913 Preterm premature rupture of membranes, unspecified as to length of time between rupture and onset of labor, third trimester: Principal | ICD-10-CM | POA: Diagnosis present

## 2016-04-17 DIAGNOSIS — F1910 Other psychoactive substance abuse, uncomplicated: Secondary | ICD-10-CM | POA: Diagnosis present

## 2016-04-17 DIAGNOSIS — F1120 Opioid dependence, uncomplicated: Secondary | ICD-10-CM | POA: Diagnosis not present

## 2016-04-17 DIAGNOSIS — Z79891 Long term (current) use of opiate analgesic: Secondary | ICD-10-CM | POA: Diagnosis not present

## 2016-04-17 DIAGNOSIS — O99320 Drug use complicating pregnancy, unspecified trimester: Secondary | ICD-10-CM | POA: Diagnosis not present

## 2016-04-17 DIAGNOSIS — F17210 Nicotine dependence, cigarettes, uncomplicated: Secondary | ICD-10-CM | POA: Diagnosis present

## 2016-04-17 DIAGNOSIS — O4290 Premature rupture of membranes, unspecified as to length of time between rupture and onset of labor, unspecified weeks of gestation: Secondary | ICD-10-CM | POA: Diagnosis present

## 2016-04-17 DIAGNOSIS — F329 Major depressive disorder, single episode, unspecified: Secondary | ICD-10-CM | POA: Diagnosis not present

## 2016-04-17 DIAGNOSIS — O99333 Smoking (tobacco) complicating pregnancy, third trimester: Secondary | ICD-10-CM | POA: Insufficient documentation

## 2016-04-17 DIAGNOSIS — Z79899 Other long term (current) drug therapy: Secondary | ICD-10-CM | POA: Insufficient documentation

## 2016-04-17 DIAGNOSIS — O99343 Other mental disorders complicating pregnancy, third trimester: Secondary | ICD-10-CM | POA: Insufficient documentation

## 2016-04-17 DIAGNOSIS — O99334 Smoking (tobacco) complicating childbirth: Secondary | ICD-10-CM | POA: Diagnosis present

## 2016-04-17 HISTORY — DX: Other psychoactive substance abuse, uncomplicated: F19.10

## 2016-04-17 LAB — URINALYSIS, ROUTINE W REFLEX MICROSCOPIC
Glucose, UA: NEGATIVE mg/dL
Hgb urine dipstick: NEGATIVE
Ketones, ur: 15 mg/dL — AB
Nitrite: NEGATIVE
Protein, ur: NEGATIVE mg/dL
Specific Gravity, Urine: >1.03 — ABNORMAL HIGH (ref 1.005–1.030)
pH: 5.5 (ref 5.0–8.0)

## 2016-04-17 LAB — POCT FERN TEST
POCT FERN TEST: NEGATIVE
POCT Fern Test: POSITIVE

## 2016-04-17 LAB — URINE MICROSCOPIC-ADD ON

## 2016-04-17 LAB — RAPID URINE DRUG SCREEN, HOSP PERFORMED
AMPHETAMINES: NOT DETECTED
BENZODIAZEPINES: NOT DETECTED
Barbiturates: NOT DETECTED
Cocaine: NOT DETECTED
OPIATES: NOT DETECTED
TETRAHYDROCANNABINOL: NOT DETECTED

## 2016-04-17 LAB — CBC
HEMATOCRIT: 34.1 % — AB (ref 36.0–46.0)
HEMOGLOBIN: 11.8 g/dL — AB (ref 12.0–15.0)
MCH: 32.2 pg (ref 26.0–34.0)
MCHC: 34.6 g/dL (ref 30.0–36.0)
MCV: 93.2 fL (ref 78.0–100.0)
Platelets: 168 10*3/uL (ref 150–400)
RBC: 3.66 MIL/uL — ABNORMAL LOW (ref 3.87–5.11)
RDW: 12.3 % (ref 11.5–15.5)
WBC: 10.7 10*3/uL — AB (ref 4.0–10.5)

## 2016-04-17 LAB — OB RESULTS CONSOLE RPR: RPR: NONREACTIVE

## 2016-04-17 LAB — OB RESULTS CONSOLE GC/CHLAMYDIA
CHLAMYDIA, DNA PROBE: NEGATIVE
GC PROBE AMP, GENITAL: NEGATIVE

## 2016-04-17 LAB — OB RESULTS CONSOLE HEPATITIS B SURFACE ANTIGEN: HEP B S AG: NEGATIVE

## 2016-04-17 LAB — OB RESULTS CONSOLE HIV ANTIBODY (ROUTINE TESTING): HIV: NONREACTIVE

## 2016-04-17 LAB — OB RESULTS CONSOLE ANTIBODY SCREEN: Antibody Screen: NEGATIVE

## 2016-04-17 LAB — OB RESULTS CONSOLE ABO/RH: RH Type: NEGATIVE

## 2016-04-17 LAB — OB RESULTS CONSOLE RUBELLA ANTIBODY, IGM: Rubella: IMMUNE

## 2016-04-17 MED ORDER — AMOXICILLIN 500 MG PO CAPS
500.0000 mg | ORAL_CAPSULE | Freq: Three times a day (TID) | ORAL | Status: DC
  Administered 2016-04-19 – 2016-04-22 (×11): 500 mg via ORAL
  Filled 2016-04-17 (×21): qty 1

## 2016-04-17 MED ORDER — TETANUS-DIPHTH-ACELL PERTUSSIS 5-2.5-18.5 LF-MCG/0.5 IM SUSP
0.5000 mL | Freq: Once | INTRAMUSCULAR | Status: AC
  Administered 2016-04-17: 0.5 mL via INTRAMUSCULAR
  Filled 2016-04-17: qty 0.5

## 2016-04-17 MED ORDER — DOCUSATE SODIUM 100 MG PO CAPS
100.0000 mg | ORAL_CAPSULE | Freq: Every day | ORAL | Status: DC
  Administered 2016-04-18 – 2016-04-22 (×5): 100 mg via ORAL
  Filled 2016-04-17 (×6): qty 1

## 2016-04-17 MED ORDER — PRENATAL MULTIVITAMIN CH
1.0000 | ORAL_TABLET | Freq: Every day | ORAL | Status: DC
  Administered 2016-04-18 – 2016-04-22 (×5): 1 via ORAL
  Filled 2016-04-17 (×5): qty 1

## 2016-04-17 MED ORDER — AZITHROMYCIN 250 MG PO TABS
1000.0000 mg | ORAL_TABLET | Freq: Once | ORAL | Status: AC
  Administered 2016-04-17: 1000 mg via ORAL
  Filled 2016-04-17: qty 4

## 2016-04-17 MED ORDER — BUPRENORPHINE HCL 8 MG SL SUBL
8.0000 mg | SUBLINGUAL_TABLET | Freq: Two times a day (BID) | SUBLINGUAL | Status: DC
  Administered 2016-04-17 – 2016-04-18 (×2): 8 mg via SUBLINGUAL
  Filled 2016-04-17 (×2): qty 1

## 2016-04-17 MED ORDER — SODIUM CHLORIDE 0.9 % IV SOLN
2.0000 g | Freq: Four times a day (QID) | INTRAVENOUS | Status: AC
  Administered 2016-04-17 – 2016-04-19 (×8): 2 g via INTRAVENOUS
  Filled 2016-04-17 (×8): qty 2000

## 2016-04-17 MED ORDER — CALCIUM CARBONATE ANTACID 500 MG PO CHEW: 2.0000 | CHEWABLE_TABLET | ORAL | Status: DC | PRN

## 2016-04-17 MED ORDER — BETAMETHASONE SOD PHOS & ACET 6 (3-3) MG/ML IJ SUSP
12.0000 mg | INTRAMUSCULAR | Status: AC
  Administered 2016-04-17 – 2016-04-18 (×2): 12 mg via INTRAMUSCULAR
  Filled 2016-04-17 (×2): qty 2

## 2016-04-17 MED ORDER — AZITHROMYCIN 500 MG PO TABS: 1000.0000 mg | ORAL_TABLET | Freq: Every day | ORAL | Status: DC

## 2016-04-17 MED ORDER — ACETAMINOPHEN 325 MG PO TABS: 650.0000 mg | ORAL_TABLET | ORAL | Status: DC | PRN

## 2016-04-17 MED ORDER — AZITHROMYCIN 1 G PO PACK
1.0000 g | PACK | Freq: Once | ORAL | Status: DC
  Filled 2016-04-17: qty 1

## 2016-04-17 MED ORDER — ZOLPIDEM TARTRATE 5 MG PO TABS: 5.0000 mg | ORAL_TABLET | Freq: Every evening | ORAL | Status: DC | PRN

## 2016-04-17 MED ORDER — ONE-DAILY MULTI VITAMINS PO TABS: 1.0000 | ORAL_TABLET | Freq: Every day | ORAL | Status: DC

## 2016-04-17 NOTE — OB Triage Note (Signed)
Patient given discharge instructions and verbalized understanding. Pt. To follow up at regularly scheduled appointment later this afternoon.  Patient left, ambulatory, in stable condition for discharge.
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
## 2016-04-17 NOTE — OB Triage Note (Signed)
Pt. Arrived to obs2 with c/o leaking of fluid.  States she started leaking yesterday at approx. 1500, then leaking stopped.  "Gush" of clear fluid noted this morning at 0400.  Pt. States baby is moving as normal. Denies contractions and vaginal bleeding. EFM intiaited.  Discussed plan of care with patient. Pt. Verbalized understanding.

## 2016-04-17 NOTE — Final Progress Note (Signed)
Physician Final Progress Note  Patient ID: Alexis [name] MRN: [id number] DOB/AGE: [birth year] 25 y.o.  Admit date: 04/17/2016 Admitting provider: Conard Novak, MD Discharge date: 04/17/2016   Admission Diagnoses:  1) intrauterine pregnancy at [redacted]w[redacted]d  2) concern for PPROM  Discharge Diagnoses:  1) intrauterine pregnancy at [redacted]w[redacted]d  2) no evidence of PPROM  History of Present Illness: The patient is a 25 y.o. female [id number] at [redacted]w[redacted]d who presents for evaluation of PPROM. She states that she has had some mucousy fluid leaking some yesterday. She woke up this morning at about 3 am feeling like she had broken her membranes.  She did not have a continuous leakage, though she reports more leakage once she got the hospital.  She notes no vaginal bleeding, no contractions. She notes +FM.  Negative workup in the hospital with this visit. Patient sent home. She is encouraged to keep her PN appointment today at [facility].  Past Medical History:  Diagnosis Date  . Anxiety   . Depression   . Substance abuse     Past Surgical History:  Procedure Laterality Date  . NO PAST SURGERIES      No current facility-administered medications on file prior to encounter.    Current Outpatient Prescriptions on File Prior to Encounter  Medication Sig Dispense Refill  . BuPROPion HCl (WELLBUTRIN PO) Take 1 tablet by mouth daily. Says she takes Wellbutrin but pharmacy had no record of it and she does not know the mg    . clonazePAM (KLONOPIN) 1 MG tablet Take 0.5 tablets (0.5 mg total) by mouth 3 (three) times daily as needed for anxiety. (Patient not taking: Reported on 04/17/2016) 30 tablet 1  . dicyclomine (BENTYL) 10 MG capsule Take 1 capsule (10 mg total) by mouth 3 (three) times daily as needed (for abdominal cramping). 30 capsule 0  . metroNIDAZOLE (FLAGYL) 500 MG tablet Take 1 tablet (500 mg total) by mouth 3 (three) times daily. (Patient not taking: Reported on 04/17/2016) 20 tablet 0  .  oxyCODONE-acetaminophen (PERCOCET) 5-325 MG tablet Take 2 tablets by mouth every 6 (six) hours as needed for moderate pain or severe pain. (Patient not taking: Reported on 04/17/2016) 12 tablet 0  . promethazine (PHENERGAN) 25 MG tablet Take 1 tablet (25 mg total) by mouth every 6 (six) hours as needed for nausea or vomiting. (Patient not taking: Reported on 04/17/2016) 20 tablet 0    Allergies  Allergen Reactions  . Tramadol Hives and Other (See Comments)    "seizure like activity."  . Vicodin [Hydrocodone-Acetaminophen] Nausea And Vomiting    Social History   Social History  . Marital status: Single    Spouse name: N/A  . Number of children: N/A  . Years of education: N/A   Occupational History  . Not on file.   Social History Main Topics  . Smoking status: Current Some Day Smoker    Packs/day: 0.50    Years: 10.00    Types: Cigarettes  . Smokeless tobacco: Never Used  . Alcohol use No  . Drug use:     Types: Heroin     Comment: 5 months  . Sexual activity: Yes    Birth control/ protection: Injection   Other Topics Concern  . Not on file   Social History Narrative  . No narrative on file    Physical Exam: BP 111/63 (BP Location: Left Arm)   Pulse 74   Temp 98.2 F (36.8 C) (Oral)   Resp 18  Ht 5\' 11"  (1.803 m)   Wt 169 lb (76.7 kg)   LMP 08/15/2015 (Approximate)   BMI 23.57 kg/m   Gen: NAD CV: RRR Pulm: CTAB Pelvic: NEFG, negative pooling, negative nitrazine, negative ferning. Cervix 1/thick/-3 Ext: no e/c/t  Consults: None  Significant Findings/ Diagnostic Studies:  PPROM workup: Negative pooling, negative nitrazine, negative ferning.  (mucus noted in vagina at cervix with no fluid leakage on valsava)  Procedures: NST Baseline: 130 Variability: moderate Accelerations: present Decelerations: absent Tocometry: no ctx   Discharge Condition: stable  Disposition: 01-Home or Self Care  Diet: Regular diet  Discharge Activity: Activity as  tolerated     Medication List    STOP taking these medications   clonazePAM 1 MG tablet Commonly known as:  KLONOPIN   dicyclomine 10 MG capsule Commonly known as:  BENTYL   metroNIDAZOLE 500 MG tablet Commonly known as:  FLAGYL   oxyCODONE-acetaminophen 5-325 MG tablet Commonly known as:  PERCOCET   WELLBUTRIN PO     TAKE these medications   buprenorphine 8 MG Subl SL tablet Commonly known as:  SUBUTEX Place 8 mg under the tongue 2 (two) times daily.   multivitamin tablet Take 1 tablet by mouth daily.   promethazine 25 MG tablet Commonly known as:  PHENERGAN Take 1 tablet (25 mg total) by mouth every 6 (six) hours as needed for nausea or vomiting.        Total time spent taking care of this patient: 30 minutes  Signed: Conard NovakJackson, Render Marley D, MD  04/17/2016, 6:45 AM
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
## 2016-04-17 NOTE — MAU Note (Signed)
Pt presents to MAU with possible SROM. Reports feeling leaking around 1500 yesterday. Woke up around 0300 with a gush of clear fluid. Went to [facility] and reports tests came back negative and she was 1 cm. Reports continuous leaking since. Denies ctx or bleeding.

## 2016-04-18 DIAGNOSIS — O99320 Drug use complicating pregnancy, unspecified trimester: Secondary | ICD-10-CM

## 2016-04-18 DIAGNOSIS — F1120 Opioid dependence, uncomplicated: Secondary | ICD-10-CM

## 2016-04-18 LAB — CULTURE, OB URINE

## 2016-04-18 LAB — GC/CHLAMYDIA PROBE AMP (~~LOC~~) NOT AT ARMC
Chlamydia: NEGATIVE
Neisseria Gonorrhea: NEGATIVE

## 2016-04-18 MED ORDER — BUPRENORPHINE HCL 8 MG SL SUBL
8.0000 mg | SUBLINGUAL_TABLET | Freq: Two times a day (BID) | SUBLINGUAL | Status: DC
  Administered 2016-04-19 – 2016-04-22 (×8): 8 mg via SUBLINGUAL
  Filled 2016-04-18 (×8): qty 1

## 2016-04-18 MED ORDER — BUPRENORPHINE HCL 8 MG SL SUBL
8.0000 mg | SUBLINGUAL_TABLET | Freq: Once | SUBLINGUAL | Status: AC
Start: 2016-04-18 — End: 2016-04-18
  Administered 2016-04-18: 8 mg via SUBLINGUAL
  Filled 2016-04-18: qty 1

## 2016-04-18 NOTE — Consult Note (Signed)
Neonatology Consult to Antenatal Patient:  I was asked by Dr. Amedeo GoryAnyanwa to see this patient in order to provide antenatal counseling due to PPROM.  Ms. Alexis [name] was admitted 04/17/16 at 33 1/[redacted] weeks GA. She is currently 33 2/7 weeks with loss of fluid; no labor or fever/chorio concerns. EFW is 2148g.  She is getting BMZ and IV Ampicillin/Azithromycin.  She has been on Buprenorphine.  There are social concerns regarding homelessness and limited resources and support.   I spoke with the patient for about 25min. We discussed the worst case of delivery in the next 1-2 days, including usual DR management, possible respiratory complications and need for support, IV access, feedings encouraged lactation though she does not express desire or willingness), LOS, Mortality and Morbidity, and long term outcomes. I gave the patient a [employer] of [employer] handout, written in lay language, that discussed the common complications and survival data of the premature infant and a summary handout with graph and table.  She also understands infant may demonstrate wihdrawal symptoms needing medical treatment if non-pharmalogical steps do not suffice.  She expressed understanding and questions at this time were answered.   We would be glad to come back if she has more questions later.  Thank you for asking me to see this patient.  Dineen Kidavid C. Leary RocaEhrmann, MD Neonatologist  The total length of face-to-face or floor/unit time for this encounter was 25 minutes. Counseling and/or coordination of care was 40 minutes of the above.

## 2016-04-18 NOTE — Progress Notes (Signed)
CSW met left a voicemail message for [employer] worker, [employer].  Patient currently has a open CPS case and MOB is in need of resources for homelessness.  CSW also need to explore safety plans with CPS for infant when infant is delivered.    Laurey Arrow, MSW, LCSW Clinical Social Work [phone]

## 2016-04-18 NOTE — Progress Notes (Signed)
UR chart review completed.  

## 2016-04-18 NOTE — Progress Notes (Signed)
CSW acknowledges consult for homeless and will complete an assessment after L&D.  Blaine HamperAngel Boyd-Gilyard, MSW, LCSW Clinical Social Work [phone]

## 2016-04-18 NOTE — Progress Notes (Signed)
FACULTY PRACTICE ANTEPARTUM COMPREHENSIVE PROGRESS NOTE  Alexis [name] is a 25 y.o. [phone] at [redacted]w[redacted]d who is admitted for PPROM.  Estimated Date of Delivery: 06/04/16 Fetal presentation is cephalic.  Length of Stay:  1 Days. Admitted 04/17/2016  Subjective: No complaints Patient reports good fetal movement.  She reports no uterine contractions, no bleeding and small amount of loss of fluid per vagina.  Vitals:  Blood pressure (!) 94/49, pulse (!) 58, temperature 98 F (36.7 C), resp. rate 16, height 5\' 11"  (1.803 m), weight 165 lb (74.8 kg), last menstrual period 08/15/2015. Physical Examination: CONSTITUTIONAL: Well-developed, well-nourished female in no acute distress.  HENT:  Normocephalic, atraumatic, External right and left ear normal. Oropharynx is clear and moist EYES: Conjunctivae and EOM are normal. Pupils are equal, round, and reactive to light. No scleral icterus.  NECK: Normal range of motion, supple, no masses SKIN: Skin is warm and dry. No rash noted. Not diaphoretic. No erythema. No pallor. NEUROLGIC: Alert and oriented to person, place, and time. Normal reflexes, muscle tone coordination. No cranial nerve deficit noted. PSYCHIATRIC: Normal mood and affect. Normal behavior. Normal judgment and thought content. CARDIOVASCULAR: Normal heart rate noted, regular rhythm RESPIRATORY: Effort and breath sounds normal, no problems with respiration noted MUSCULOSKELETAL: Normal range of motion. No edema and no tenderness. 2+ distal pulses. ABDOMEN: Soft, nontender, nondistended, gravid. CERVIX:  Deferred  Fetal monitoring: FHR: 110 bpm, Variability: moderate, Accelerations: Present, Decelerations: Absent  Uterine activity: Rare contractions per hour  Results for orders placed or performed during the hospital encounter of 04/17/16 (from the past 48 hour(s))  Urinalysis, Routine w reflex microscopic (not at [facility])     Status: Abnormal   Collection Time: 04/17/16 11:20 AM  Result  Value Ref Range   Color, Urine AMBER (A) YELLOW    Comment: BIOCHEMICALS MAY BE AFFECTED BY COLOR   APPearance CLEAR CLEAR   Specific Gravity, Urine >1.030 (H) 1.005 - 1.030   pH 5.5 5.0 - 8.0   Glucose, UA NEGATIVE NEGATIVE mg/dL   Hgb urine dipstick NEGATIVE NEGATIVE   Bilirubin Urine SMALL (A) NEGATIVE   Ketones, ur 15 (A) NEGATIVE mg/dL   Protein, ur NEGATIVE NEGATIVE mg/dL   Nitrite NEGATIVE NEGATIVE   Leukocytes, UA SMALL (A) NEGATIVE  Urine microscopic-add on     Status: Abnormal   Collection Time: 04/17/16 11:20 AM  Result Value Ref Range   Squamous Epithelial / LPF 0-5 (A) NONE SEEN   WBC, UA 0-5 0 - 5 WBC/hpf   RBC / HPF 0-5 0 - 5 RBC/hpf   Bacteria, UA MANY (A) NONE SEEN   Urine-Other MUCOUS PRESENT   Urine rapid drug screen (hosp performed)     Status: None   Collection Time: 04/17/16 11:20 AM  Result Value Ref Range   Opiates NONE DETECTED NONE DETECTED   Cocaine NONE DETECTED NONE DETECTED   Benzodiazepines NONE DETECTED NONE DETECTED   Amphetamines NONE DETECTED NONE DETECTED   Tetrahydrocannabinol NONE DETECTED NONE DETECTED   Barbiturates NONE DETECTED NONE DETECTED    Comment:        DRUG SCREEN FOR MEDICAL PURPOSES ONLY.  IF CONFIRMATION IS NEEDED FOR ANY PURPOSE, NOTIFY LAB WITHIN 5 DAYS.        LOWEST DETECTABLE LIMITS FOR URINE DRUG SCREEN Drug Class       Cutoff (ng/mL) Amphetamine      1000 Barbiturate      200 Benzodiazepine   200 Tricyclics       300  Opiates          300 Cocaine          300 THC              50   Fern Test     Status: Normal   Collection Time: 04/17/16 11:51 AM  Result Value Ref Range   POCT Fern Test Negative = intact amniotic membranes   Fern Test     Status: Normal   Collection Time: 04/17/16 12:57 PM  Result Value Ref Range   POCT Fern Test Positive = ruptured amniotic membanes   Type and screen [facility] [place]     Status: None (Preliminary result)   Collection Time: 04/17/16  1:21 PM  Result Value  Ref Range   ABO/RH(D) A NEG    Antibody Screen POS    Sample Expiration 04/20/2016    Antibody Identification PASSIVELY ACQUIRED ANTI-D    DAT, 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    Unit Number [id number]    Blood Component Type RED CELLS,LR    Unit division 00    Status of Unit ALLOCATED    Transfusion Status OK TO TRANSFUSE    Crossmatch Result COMPATIBLE    Unit Number [id number]    Blood Component Type RBC LR PHER1    Unit division 00    Status of Unit ALLOCATED    Transfusion Status OK TO TRANSFUSE    Crossmatch Result COMPATIBLE   CBC on admission     Status: Abnormal   Collection Time: 04/17/16  1:21 PM  Result Value Ref Range   WBC 10.7 (H) 4.0 - 10.5 K/uL   RBC 3.66 (L) 3.87 - 5.11 MIL/uL   Hemoglobin 11.8 (L) 12.0 - 15.0 g/dL   HCT 30.8 (L) 65.7 - 84.6 %   MCV 93.2 78.0 - 100.0 fL   MCH 32.2 26.0 - 34.0 pg   MCHC 34.6 30.0 - 36.0 g/dL   RDW 96.2 95.2 - 84.1 %   Platelets 168 150 - 400 K/uL  OB RESULTS CONSOLE GC/Chlamydia     Status: None   Collection Time: 04/17/16  5:24 PM  Result Value Ref Range   Gonorrhea Negative    Chlamydia Negative   OB RESULTS CONSOLE RPR     Status: None   Collection Time: 04/17/16  5:24 PM  Result Value Ref Range   RPR Nonreactive   OB RESULTS CONSOLE HIV antibody     Status: None   Collection Time: 04/17/16  5:24 PM  Result Value Ref Range   HIV Non-reactive   OB RESULTS CONSOLE Rubella Antibody     Status: None   Collection Time: 04/17/16  5:24 PM  Result Value Ref Range   Rubella Immune   OB RESULTS CONSOLE Hepatitis B surface antigen     Status: None   Collection Time: 04/17/16  5:24 PM  Result Value Ref Range   Hepatitis B Surface Ag Negative   OB RESULTS CONSOLE ABO/Rh     Status: None   Collection Time: 04/17/16  5:24 PM  Result Value Ref Range   RH Type  Negative    ABO Grouping A   OB RESULTS CONSOLE Antibody Screen     Status: None   Collection Time: 04/17/16  5:24 PM  Result Value Ref Range   Antibody Screen Negative     04/17/2016 OBSTETRICAL ULTRASOUND:  [redacted]w[redacted]d EFW 2148g (4-12)/60%,  AFI 7.33 cm, cephalic  Current scheduled medications . ampicillin (OMNIPEN) IV  2 g Intravenous  Q6H   Followed by  . [START ON 04/19/2016] amoxicillin  500 mg Oral Q8H  . betamethasone acetate-betamethasone sodium phosphate  12 mg Intramuscular Q24H  . buprenorphine  8 mg Sublingual BID  . docusate sodium  100 mg Oral Daily  . prenatal multivitamin  1 tablet Oral Q1200  I have reviewed the patient's current medications.  ASSESSMENT: Principal Problem:   Preterm premature rupture of membranes (PPROM) at 8872w1d with unknown onset of labor Active Problems:   Pregnancy complicated by subutex maintenance, antepartum (HCC) Patient is currently 5519w2d  PLAN: Continue betamethasone regimen Continue latency antibiotics Continue Subutex 8 mg daily Category 1 FHR tracing, continue NST bid NICU consult placed. SW consult also placed. Plan for delivery at 34 weeks or earlier if indicated due to chorioamnionitis, abruption, NRFHT or other maternal-fetal concerns. Continue routine antenatal care.   Jaynie CollinsUGONNA  Alexis Deanda, MD, FACOG Attending Obstetrician & Gynecologist Faculty Practice, [facility] [place]

## 2016-04-19 DIAGNOSIS — O42913 Preterm premature rupture of membranes, unspecified as to length of time between rupture and onset of labor, third trimester: Principal | ICD-10-CM

## 2016-04-19 LAB — CULTURE, BETA STREP (GROUP B ONLY)

## 2016-04-19 LAB — OB RESULTS CONSOLE GBS: STREP GROUP B AG: NEGATIVE
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
## 2016-04-19 NOTE — Progress Notes (Signed)
Patient ID: Alexis [name], female   DOB: 1991/05/24, 25 y.o.   MRN: [id number] FACULTY PRACTICE ANTEPARTUM COMPREHENSIVE PROGRESS NOTE  Alexis [name] is a 25 y.o. [id number] at [redacted]w[redacted]d who is admitted for PPROM.  Estimated Date of Delivery: 06/04/16 Fetal presentation is cephalic.  Length of Stay:  2 Days. Admitted 04/17/2016  Subjective: No complaints Patient reports good fetal movement.  She reports no uterine contractions, no bleeding and small amount of loss of fluid per vagina.  Vitals:  Blood pressure (!) 83/40, pulse 65, temperature 98.1 F (36.7 C), temperature source Oral, resp. rate 18, height 5\' 11"  (1.803 m), weight 165 lb (74.8 kg), last menstrual period 08/15/2015. Physical Examination: CONSTITUTIONAL: Well-developed, well-nourished female in no acute distress.  CARDIOVASCULAR: Normal heart rate noted, regular rhythm RESPIRATORY: Effort and breath sounds normal, no problems with respiration noted MUSCULOSKELETAL: Normal range of motion. No edema and no tenderness. 2+ distal pulses. ABDOMEN: Soft, nontender, gravid. CERVIX:  Deferred  Fetal monitoring: FHR: 120 bpm, Variability: moderate, Accelerations: Present, Decelerations: Absent  Uterine activity: Rare contractions per hour  Results for orders placed or performed during the hospital encounter of 04/17/16 (from the past 48 hour(s))  Urinalysis, Routine w reflex microscopic (not at [facility])     Status: Abnormal   Collection Time: 04/17/16 11:20 AM  Result Value Ref Range   Color, Urine AMBER (A) YELLOW    Comment: BIOCHEMICALS MAY BE AFFECTED BY COLOR   APPearance CLEAR CLEAR   Specific Gravity, Urine >1.030 (H) 1.005 - 1.030   pH 5.5 5.0 - 8.0   Glucose, UA NEGATIVE NEGATIVE mg/dL   Hgb urine dipstick NEGATIVE NEGATIVE   Bilirubin Urine SMALL (A) NEGATIVE   Ketones, ur 15 (A) NEGATIVE mg/dL   Protein, ur NEGATIVE NEGATIVE mg/dL   Nitrite NEGATIVE NEGATIVE   Leukocytes, UA SMALL (A) NEGATIVE  Urine  microscopic-add on     Status: Abnormal   Collection Time: 04/17/16 11:20 AM  Result Value Ref Range   Squamous Epithelial / LPF 0-5 (A) NONE SEEN   WBC, UA 0-5 0 - 5 WBC/hpf   RBC / HPF 0-5 0 - 5 RBC/hpf   Bacteria, UA MANY (A) NONE SEEN   Urine-Other MUCOUS PRESENT   OB Urine Culture     Status: Abnormal   Collection Time: 04/17/16 11:20 AM  Result Value Ref Range   Specimen Description URINE, RANDOM    Special Requests NONE    Culture (A)     MULTIPLE SPECIES PRESENT, SUGGEST RECOLLECTION NO GROUP B STREP (S.AGALACTIAE) ISOLATED Performed at [facility]    Report Status 04/18/2016 FINAL   Urine rapid drug screen (hosp performed)     Status: None   Collection Time: 04/17/16 11:20 AM  Result Value Ref Range   Opiates NONE DETECTED NONE DETECTED   Cocaine NONE DETECTED NONE DETECTED   Benzodiazepines NONE DETECTED NONE DETECTED   Amphetamines NONE DETECTED NONE DETECTED   Tetrahydrocannabinol NONE DETECTED NONE DETECTED   Barbiturates NONE DETECTED NONE DETECTED    Comment:        DRUG SCREEN FOR MEDICAL PURPOSES ONLY.  IF CONFIRMATION IS NEEDED FOR ANY PURPOSE, NOTIFY LAB WITHIN 5 DAYS.        LOWEST DETECTABLE LIMITS FOR URINE DRUG SCREEN Drug Class       Cutoff (ng/mL) Amphetamine      1000 Barbiturate      200 Benzodiazepine   200 Tricyclics       300 Opiates  300 Cocaine          300 THC              50   Fern Test     Status: Normal   Collection Time: 04/17/16 11:51 AM  Result Value Ref Range   POCT Fern Test Negative = intact amniotic membranes   Fern Test     Status: Normal   Collection Time: 04/17/16 12:57 PM  Result Value Ref Range   POCT Fern Test Positive = ruptured amniotic membanes   Type and screen [facility] [place]     Status: None (Preliminary result)   Collection Time: 04/17/16  1:21 PM  Result Value Ref Range   ABO/RH(D) A NEG    Antibody Screen POS    Sample Expiration 04/20/2016    Antibody Identification  PASSIVELY ACQUIRED ANTI-D    DAT, 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    Unit Number [id number]    Blood Component Type RED CELLS,LR    Unit division 00    Status of Unit ALLOCATED    Transfusion Status OK TO TRANSFUSE    Crossmatch Result COMPATIBLE    Unit Number [id number]    Blood Component Type RBC LR PHER1    Unit division 00    Status of Unit ALLOCATED    Transfusion Status OK TO TRANSFUSE    Crossmatch Result COMPATIBLE   CBC on admission     Status: Abnormal   Collection Time: 04/17/16  1:21 PM  Result Value Ref Range   WBC 10.7 (H) 4.0 - 10.5 K/uL   RBC 3.66 (L) 3.87 - 5.11 MIL/uL   Hemoglobin 11.8 (L) 12.0 - 15.0 g/dL   HCT 30.8 (L) 65.7 - 84.6 %   MCV 93.2 78.0 - 100.0 fL   MCH 32.2 26.0 - 34.0 pg   MCHC 34.6 30.0 - 36.0 g/dL   RDW 96.2 95.2 - 84.1 %   Platelets 168 150 - 400 K/uL  Culture, beta strep (group b only)     Status: None (Preliminary result)   Collection Time: 04/17/16  1:39 PM  Result Value Ref Range   Specimen Description VAGINAL/RECTAL    Special Requests NONE    Culture      CULTURE REINCUBATED FOR BETTER GROWTH Performed at [facility]    Report Status PENDING   OB RESULTS CONSOLE GC/Chlamydia     Status: None   Collection Time: 04/17/16  5:24 PM  Result Value Ref Range   Gonorrhea Negative    Chlamydia Negative   OB RESULTS CONSOLE RPR     Status: None   Collection Time: 04/17/16  5:24 PM  Result Value Ref Range   RPR Nonreactive   OB RESULTS CONSOLE HIV antibody     Status: None   Collection Time: 04/17/16  5:24 PM  Result Value Ref Range   HIV Non-reactive   OB RESULTS CONSOLE Rubella Antibody     Status: None   Collection Time: 04/17/16  5:24 PM  Result Value Ref Range   Rubella Immune   OB RESULTS CONSOLE Hepatitis B surface antigen     Status: None   Collection Time: 04/17/16  5:24 PM  Result Value Ref Range   Hepatitis B Surface Ag Negative   OB RESULTS CONSOLE ABO/Rh     Status: None   Collection Time: 04/17/16  5:24 PM  Result Value  Ref Range   RH Type  Negative    ABO Grouping A   OB RESULTS  CONSOLE Antibody Screen     Status: None   Collection Time: 04/17/16  5:24 PM  Result Value Ref Range   Antibody Screen Negative    04/17/2016 OBSTETRICAL ULTRASOUND:  613w1d EFW 2148g (4-12)/60%,  AFI 7.33 cm, cephalic  Current scheduled medications . ampicillin (OMNIPEN) IV  2 g Intravenous Q6H   Followed by  . amoxicillin  500 mg Oral Q8H  . buprenorphine  8 mg Sublingual Q12H  . docusate sodium  100 mg Oral Daily  . prenatal multivitamin  1 tablet Oral Q1200  I have reviewed the patient's current medications.  ASSESSMENT: Principal Problem:   Preterm premature rupture of membranes (PPROM) at 213w1d with unknown onset of labor Active Problems:   Pregnancy complicated by subutex maintenance, antepartum (HCC) Patient is currently 1051w2d  PLAN: 1) PPROM - patient completed betamethasone  - Continue latency antibiotics - Continue monitoring for si/sx of chorioamnionitis - Delivery plan at 34 weeks or sooner if other maternal-fetal indications- Category 1 FHR tracing, continue NST bid  2) History of substance abuse - Continue Subutex 8 mg daily - SW to complete assessment s/p delivery   Continue routine antenatal care.

## 2016-04-20 DIAGNOSIS — Z3A33 33 weeks gestation of pregnancy: Secondary | ICD-10-CM

## 2016-04-20 MED ORDER — SODIUM CHLORIDE 0.9% FLUSH
3.0000 mL | Freq: Two times a day (BID) | INTRAVENOUS | Status: DC
  Administered 2016-04-20 – 2016-04-22 (×5): 3 mL via INTRAVENOUS

## 2016-04-20 MED ORDER — SODIUM CHLORIDE 0.9% FLUSH: 3.0000 mL | INTRAVENOUS | Status: DC | PRN

## 2016-04-20 NOTE — Progress Notes (Signed)
Patient ID: Alexis SnowballKristen D [name], female   DOB: [birth year], 25 y.o.   MRN: [id number] FACULTY PRACTICE ANTEPARTUM COMPREHENSIVE PROGRESS NOTE  Alexis [name] is a 25 y.o. [id number] at 1011w4d who is admitted for PPROM.  Estimated Date of Delivery: 06/04/16 Fetal presentation is cephalic.  Length of Stay:  3 Days. Admitted 04/17/2016  Subjective: No complaints Patient reports good fetal movement.  She reports no uterine contractions, no bleeding and small amount of loss of fluid per vagina.  Vitals:  Blood pressure (!) 90/45, pulse 63, temperature 98 F (36.7 C), temperature source Oral, resp. rate 16, height 5\' 11"  (1.803 m), weight 165 lb (74.8 kg), last menstrual period 08/15/2015. Physical Examination: CONSTITUTIONAL: Well-developed, well-nourished female in no acute distress.  CARDIOVASCULAR: Normal heart rate noted, regular rhythm RESPIRATORY: Effort and breath sounds normal, no problems with respiration noted MUSCULOSKELETAL: Normal range of motion. No edema and no tenderness. 2+ distal pulses. ABDOMEN: Soft, nontender, gravid. CERVIX:  Deferred  Fetal monitoring: FHR: 120 bpm, Variability: moderate, Accelerations: Present, Decelerations: Absent  Uterine activity: Rare contractions per hour  Results for orders placed or performed during the hospital encounter of 04/17/16 (from the past 48 hour(s))  OB RESULT CONSOLE Group B Strep     Status: None   Collection Time: 04/19/16 12:00 AM  Result Value Ref Range   GBS Negative    OBSTETRICAL ULTRASOUND: 04/17/2016 3156w1d EFW 2148g (4-12)/60%,  AFI 7.33 cm, cephalic  Current scheduled medications . amoxicillin  500 mg Oral Q8H  . buprenorphine  8 mg Sublingual Q12H  . docusate sodium  100 mg Oral Daily  . prenatal multivitamin  1 tablet Oral Q1200  I have reviewed the patient's current medications.  ASSESSMENT: Principal Problem:   Preterm premature rupture of membranes (PPROM) at 5656w1d with unknown onset of labor Active  Problems:   Pregnancy complicated by subutex maintenance, antepartum (HCC)  PLAN: 1) PPROM - Patient completed betamethasone  - Continue latency antibiotics - Continue monitoring for si/sx of chorioamnionitis - Category 1 FHR tracing, continue NST bid - Delivery plan at 34 weeks or sooner if other maternal-fetal indications  2) History of substance abuse - Continue Subutex 8 mg daily - SW to complete assessment s/p delivery  Continue routine antenatal care.   Alexis NewcomerUgonna A Adrionna Delcid, MD

## 2016-04-21 ENCOUNTER — Inpatient Hospital Stay (HOSPITAL_COMMUNITY): Admit: 2016-04-21 | Payer: Medicaid Other

## 2016-04-21 LAB — TYPE AND SCREEN
ABO/RH(D): A NEG
Antibody Screen: POSITIVE
DAT, IgG: NEGATIVE
UNIT DIVISION: 0
UNIT DIVISION: 0

## 2016-04-21 NOTE — Anesthesia Pain Management Evaluation Note (Signed)
  CRNA Pain Management Visit Note  Patient: Alexis [name], 25 y.o., female  "Hello I am a [facility]. We have an anesthesia team available at all times to provide care throughout the hospital, including epidural management and anesthesia for C-section. I don't know your plan for the delivery whether it a natural birth, water birth, IV sedation, nitrous supplementation, doula or epidural, but we want to meet your pain goals."   1.Was your pain managed to your expectations on prior hospitalizations?   Yes   2.What is your expectation for pain management during this hospitalization?     Epidural  3.How can we help you reach that goal? Epidural   Record the patient's initial score and the patient's pain goal.   Pain: 0  Pain Goal: 5 [facility] wants you to be able to say your pain was always managed very well.  Alexis [name] 04/21/2016

## 2016-04-21 NOTE — Progress Notes (Signed)
Pt reported that she is doing well, but that she is tired and has not been sleeping well.  She stated that she is nervous about the induction because her baby is early.  She stated that while she has had other babies before, this one feels very different because of the gestation.  She made reference to having "too much time think" while here in the hospital and not wanting to get "depressed."  I let her know that we are available if it would be helpful to talk about anything that is difficult right now.  She declined.  She stated that she sometimes draws or writes to help her stay positive, so I brought her some art supplies and writing supplies.  Chaplain Dyanne CarrelKaty Sanjit Mcmichael, Bcc Pager, [phone] 1:21 PM    04/21/16 1300  Clinical Encounter Type  Visited With Patient  Visit Type Initial  Referral From Nurse (Pt's RN from Friday, Mickie BailAnita Sowder)

## 2016-04-21 NOTE — Progress Notes (Signed)
Patient ID: Lamont SnowballKristen D Neely, female   DOB: 03-Dec-1990, 25 y.o.   MRN: [id number] FACULTY PRACTICE ANTEPARTUM COMPREHENSIVE PROGRESS NOTE  Lamont SnowballKristen D Huneke is a 25 y.o. [id number] at 8335w5d who is admitted for PPROM.  Estimated Date of Delivery: 06/04/16 Fetal presentation is cephalic.  Length of Stay:  4 Days. Admitted 04/17/2016  Subjective: No complaints Patient reports good fetal movement.  She reports no uterine contractions, no bleeding and small amount of loss of fluid per vagina.  Vitals:  Blood pressure 106/67, pulse 81, temperature 98.7 F (37.1 C), temperature source Oral, resp. rate 18, height 5\' 11"  (1.803 m), weight 165 lb (74.8 kg), last menstrual period 08/15/2015. Physical Examination: CONSTITUTIONAL: Well-developed, well-nourished female in no acute distress.  CARDIOVASCULAR: Normal heart rate noted, regular rhythm RESPIRATORY: Effort and breath sounds normal, no problems with respiration noted MUSCULOSKELETAL: Normal range of motion. No edema and no tenderness. 2+ distal pulses. ABDOMEN: Soft, nontender, gravid. CERVIX:  Deferred  Fetal monitoring: FHR: 130 bpm, Variability: moderate, Accelerations: Present, Decelerations: Absent  Uterine activity: Rare contractions per hour  No results found for this or any previous visit (from the past 48 hour(s)). OBSTETRICAL ULTRASOUND: 04/17/2016 3422w1d EFW 2148g (4-12)/60%,  AFI 7.33 cm, cephalic  Current scheduled medications . amoxicillin  500 mg Oral Q8H  . buprenorphine  8 mg Sublingual Q12H  . docusate sodium  100 mg Oral Daily  . prenatal multivitamin  1 tablet Oral Q1200  . sodium chloride flush  3 mL Intravenous Q12H  I have reviewed the patient's current medications.  ASSESSMENT: Principal Problem:   Preterm premature rupture of membranes (PPROM) at 4022w1d with unknown onset of labor Active Problems:   Pregnancy complicated by subutex maintenance, antepartum (HCC)  PLAN: 1) PPROM - Patient completed  betamethasone  - Continue latency antibiotics - Continue monitoring for si/sx of chorioamnionitis - Category 1 FHR tracing, continue NST bid - Delivery plan at 34 weeks or sooner if other maternal-fetal indications  2) History of substance abuse - Continue Subutex 8 mg daily - SW to complete assessment s/p delivery  Continue routine antenatal care.   Catalina AntiguaPeggy Aleksis Jiggetts, MD

## 2016-04-22 ENCOUNTER — Inpatient Hospital Stay (HOSPITAL_COMMUNITY): Payer: Medicaid Other | Admitting: Anesthesiology

## 2016-04-22 ENCOUNTER — Encounter (HOSPITAL_COMMUNITY): Payer: Self-pay | Admitting: Anesthesiology

## 2016-04-22 DIAGNOSIS — O42919 Preterm premature rupture of membranes, unspecified as to length of time between rupture and onset of labor, unspecified trimester: Secondary | ICD-10-CM

## 2016-04-22 DIAGNOSIS — F1120 Opioid dependence, uncomplicated: Secondary | ICD-10-CM

## 2016-04-22 DIAGNOSIS — O99320 Drug use complicating pregnancy, unspecified trimester: Secondary | ICD-10-CM

## 2016-04-22 DIAGNOSIS — Z79891 Long term (current) use of opiate analgesic: Secondary | ICD-10-CM

## 2016-04-22 LAB — CBC
HEMATOCRIT: 32.7 % — AB (ref 36.0–46.0)
HEMOGLOBIN: 11.2 g/dL — AB (ref 12.0–15.0)
MCH: 32.3 pg (ref 26.0–34.0)
MCHC: 34.3 g/dL (ref 30.0–36.0)
MCV: 94.2 fL (ref 78.0–100.0)
Platelets: 147 10*3/uL — ABNORMAL LOW (ref 150–400)
RBC: 3.47 MIL/uL — AB (ref 3.87–5.11)
RDW: 12.6 % (ref 11.5–15.5)
WBC: 12.6 10*3/uL — AB (ref 4.0–10.5)

## 2016-04-22 MED ORDER — EPHEDRINE 5 MG/ML INJ
10.0000 mg | INTRAVENOUS | Status: DC | PRN
  Filled 2016-04-22: qty 4

## 2016-04-22 MED ORDER — SOD CITRATE-CITRIC ACID 500-334 MG/5ML PO SOLN: 30.0000 mL | ORAL | Status: DC | PRN

## 2016-04-22 MED ORDER — LACTATED RINGERS IV SOLN
500.0000 mL | INTRAVENOUS | Status: DC | PRN
  Administered 2016-04-22: 1000 mL via INTRAVENOUS

## 2016-04-22 MED ORDER — ACETAMINOPHEN 325 MG PO TABS: 650.0000 mg | ORAL_TABLET | ORAL | Status: DC | PRN

## 2016-04-22 MED ORDER — SODIUM BICARBONATE 8.4 % IV SOLN
INTRAVENOUS | Status: DC | PRN
  Administered 2016-04-22: 6 mL via EPIDURAL
  Administered 2016-04-23: 9 mL via EPIDURAL

## 2016-04-22 MED ORDER — FENTANYL 2.5 MCG/ML BUPIVACAINE 1/10 % EPIDURAL INFUSION (WH - ANES)
14.0000 mL/h | INTRAMUSCULAR | Status: DC | PRN
  Administered 2016-04-22 – 2016-04-23 (×3): 14 mL/h via EPIDURAL
  Filled 2016-04-22 (×3): qty 125

## 2016-04-22 MED ORDER — DIPHENHYDRAMINE HCL 50 MG/ML IJ SOLN: 12.5000 mg | INTRAMUSCULAR | Status: DC | PRN

## 2016-04-22 MED ORDER — PHENYLEPHRINE 40 MCG/ML (10ML) SYRINGE FOR IV PUSH (FOR BLOOD PRESSURE SUPPORT)
80.0000 ug | PREFILLED_SYRINGE | INTRAVENOUS | Status: DC | PRN
  Filled 2016-04-22 (×2): qty 10
  Filled 2016-04-22: qty 5

## 2016-04-22 MED ORDER — ONDANSETRON HCL 4 MG/2ML IJ SOLN: 4.0000 mg | Freq: Four times a day (QID) | INTRAMUSCULAR | Status: DC | PRN

## 2016-04-22 MED ORDER — FENTANYL CITRATE (PF) 100 MCG/2ML IJ SOLN
50.0000 ug | INTRAMUSCULAR | Status: DC | PRN
Start: 2016-04-22 — End: 2016-04-23

## 2016-04-22 MED ORDER — LACTATED RINGERS IV SOLN: 500.0000 mL | Freq: Once | INTRAVENOUS | Status: DC

## 2016-04-22 MED ORDER — PHENYLEPHRINE 40 MCG/ML (10ML) SYRINGE FOR IV PUSH (FOR BLOOD PRESSURE SUPPORT)
80.0000 ug | PREFILLED_SYRINGE | INTRAVENOUS | Status: DC | PRN
  Filled 2016-04-22: qty 5
  Filled 2016-04-22: qty 10

## 2016-04-22 MED ORDER — LIDOCAINE HCL (PF) 1 % IJ SOLN
INTRAMUSCULAR | Status: DC | PRN
  Administered 2016-04-22 (×2): 6 mL via EPIDURAL

## 2016-04-22 MED ORDER — OXYTOCIN 40 UNITS IN LACTATED RINGERS INFUSION - SIMPLE MED
2.5000 [IU]/h | INTRAVENOUS | Status: DC
  Administered 2016-04-23: 2.5 [IU]/h via INTRAVENOUS
  Filled 2016-04-22: qty 1000

## 2016-04-22 MED ORDER — OXYTOCIN BOLUS FROM INFUSION
500.0000 mL | Freq: Once | INTRAVENOUS | Status: AC
  Administered 2016-04-23: 500 mL via INTRAVENOUS

## 2016-04-22 MED ORDER — LIDOCAINE HCL (PF) 1 % IJ SOLN
30.0000 mL | INTRAMUSCULAR | Status: DC | PRN
  Filled 2016-04-22: qty 30

## 2016-04-22 MED ORDER — LACTATED RINGERS IV SOLN
INTRAVENOUS | Status: DC
  Administered 2016-04-22: 125 mL/h via INTRAVENOUS
  Administered 2016-04-22: 21:00:00 via INTRAVENOUS

## 2016-04-22 NOTE — Progress Notes (Signed)
Called by Trula Orehristina RN for c/o pain on left side.  Right side comfortable. Right leg heavy. Pain on left side. Patient currently positioned on left side.  Dosed with lidocaine 2% with epi  (6 cc). Patient observed for 10 minutes. Patient reports improved pain relief on left side.

## 2016-04-22 NOTE — Progress Notes (Signed)
Report given to C. Joseph ArtWoods, Charity fundraiserN.

## 2016-04-22 NOTE — Progress Notes (Signed)
EFM applied secondary pt c/o ctxs every 10 minutes since 0500.

## 2016-04-22 NOTE — Anesthesia Procedure Notes (Signed)
Epidural Patient location during procedure: OB Start time: 04/22/2016 3:40 PM End time: 04/22/2016 3:44 PM  Staffing Anesthesiologist: Leilani AbleHATCHETT, Tomio Kirk Performed: anesthesiologist   Preanesthetic Checklist Completed: patient identified, surgical consent, pre-op evaluation, timeout performed, IV checked, risks and benefits discussed and monitors and equipment checked  Epidural Patient position: sitting Prep: site prepped and draped and DuraPrep Patient monitoring: continuous pulse ox and blood pressure Approach: midline Location: L3-L4 Injection technique: LOR air  Needle:  Needle type: Tuohy  Needle gauge: 17 G Needle length: 9 cm and 9 Needle insertion depth: 5 cm cm Catheter type: closed end flexible Catheter size: 19 Gauge Catheter at skin depth: 10 cm Test dose: negative and Other  Assessment Sensory level: T10 Events: blood not aspirated, injection not painful, no injection resistance, negative IV test and no paresthesia  Additional Notes Reason for block:procedure for pain

## 2016-04-22 NOTE — Anesthesia Preprocedure Evaluation (Signed)
Anesthesia Evaluation  Patient identified by MRN, date of birth, ID band Patient awake    Reviewed: Allergy & Precautions, H&P , NPO status , Patient's Chart, lab work & pertinent test results  Airway Mallampati: I  TM Distance: >3 FB Neck ROM: full    Dental no notable dental hx.    Pulmonary neg pulmonary ROS, Current Smoker,    Pulmonary exam normal        Cardiovascular negative cardio ROS Normal cardiovascular exam     Neuro/Psych negative neurological ROS     GI/Hepatic negative GI ROS, Neg liver ROS,   Endo/Other  negative endocrine ROS  Renal/GU negative Renal ROS     Musculoskeletal   Abdominal Normal abdominal exam  (+)   Peds  Hematology negative hematology ROS (+)   Anesthesia Other Findings   Reproductive/Obstetrics (+) Pregnancy                             Anesthesia Physical Anesthesia Plan  ASA: II  Anesthesia Plan: Epidural   Post-op Pain Management:    Induction:   Airway Management Planned:   Additional Equipment:   Intra-op Plan:   Post-operative Plan:   Informed Consent: I have reviewed the patients History and Physical, chart, labs and discussed the procedure including the risks, benefits and alternatives for the proposed anesthesia with the patient or authorized representative who has indicated his/her understanding and acceptance.     Plan Discussed with:   Anesthesia Plan Comments:         Anesthesia Quick Evaluation

## 2016-04-22 NOTE — Progress Notes (Signed)
OB note CTSP for increased pain and pressure and feels them q10-459m that feels ?more intense than this AM.  Fetus category I and ?one UC seen in 1772m as pt just put on the EFM NAD Abd: soft, NTTP, gravd SSE: clear, non malodorous fluid in vault and cx visually 3-4 and fetal hair seen SVE 4/75/-1/cephalic  A/P: ?PTL vs latent labor Will transition her over to active but won't augment until 34wks later tonight. S/p bmz on admission on 9/14 and currently on latency abx.  GBS neg  Pt told to let us know if more pain or pressure  Cornelia Copaharlie Toneka Fullen, Jr MD Attending Center for [facility](Faculty Practice)

## 2016-04-22 NOTE — Progress Notes (Signed)
Patient ID: Alexis [name], female   DOB: July 27, 1991, 25 y.o.   MRN: [id number] FACULTY PRACTICE ANTEPARTUM COMPREHENSIVE PROGRESS NOTE  Alexis [name] is a 25 y.o. [id number] at 5970w6d who is admitted for PPROM.  Estimated Date of Delivery: 06/04/16 Fetal presentation is cephalic.  Length of Stay:  5 Days. Admitted 04/17/2016  Subjective: Having some cramps - feel like gas cramps. Patient reports good fetal movement.  She reports no uterine contractions, no bleeding and small amount of loss of fluid per vagina.  Vitals:  Blood pressure (!) 95/47, pulse 68, temperature 98.8 F (37.1 C), temperature source Oral, resp. rate 20, height 5\' 11"  (1.803 m), weight 165 lb (74.8 kg), last menstrual period 08/15/2015. Physical Examination: CONSTITUTIONAL: Well-developed, well-nourished female in no acute distress.  Heart: regular rate, no murmur Lungs: clear to auscultation bilaterally, no wheezing.  RESPIRATORY: Effort and breath sounds normal, no problems with respiration noted MUSCULOSKELETAL: Normal range of motion. No edema and no tenderness. 2+ distal pulses. ABDOMEN: Soft, nontender, gravid. CERVIX:  Deferred  Fetal monitoring: FHR: 130 bpm, Variability: moderate, Accelerations: Present, Decelerations: Variable decel on first NST, no decels on second. Uterine activity: Rare contractions per hour  No results found for this or any previous visit (from the past 48 hour(s)). OBSTETRICAL ULTRASOUND: 04/17/2016 2346w1d EFW 2148g (4-12)/60%,  AFI 7.33 cm, cephalic  Current scheduled medications . amoxicillin  500 mg Oral Q8H  . buprenorphine  8 mg Sublingual Q12H  . docusate sodium  100 mg Oral Daily  . prenatal multivitamin  1 tablet Oral Q1200  . sodium chloride flush  3 mL Intravenous Q12H  I have reviewed the patient's current medications.  ASSESSMENT: Principal Problem:   Preterm premature rupture of membranes (PPROM) at 3346w1d with unknown onset of labor Active Problems:  Pregnancy complicated by subutex maintenance, antepartum (HCC)  PLAN: 1) PPROM - Patient completed betamethasone  - Continue latency antibiotics - Continue monitoring for si/sx of chorioamnionitis - Category 1 FHR tracing, continue NST bid - Delivery plan at 34 weeks (tomorrow)  2) History of substance abuse - Continue Subutex 8 mg daily - SW to complete assessment s/p delivery  Continue routine antenatal care.   Levie HeritageJacob J Stinson, DO

## 2016-04-23 ENCOUNTER — Inpatient Hospital Stay (HOSPITAL_COMMUNITY): Admit: 2016-04-23 | Payer: Medicaid Other

## 2016-04-23 ENCOUNTER — Encounter (HOSPITAL_COMMUNITY): Payer: Self-pay | Admitting: *Deleted

## 2016-04-23 DIAGNOSIS — O42913 Preterm premature rupture of membranes, unspecified as to length of time between rupture and onset of labor, third trimester: Secondary | ICD-10-CM

## 2016-04-23 DIAGNOSIS — Z3A33 33 weeks gestation of pregnancy: Secondary | ICD-10-CM

## 2016-04-23 DIAGNOSIS — O99334 Smoking (tobacco) complicating childbirth: Secondary | ICD-10-CM

## 2016-04-23 DIAGNOSIS — O99324 Drug use complicating childbirth: Secondary | ICD-10-CM

## 2016-04-23 LAB — RPR: RPR: NONREACTIVE

## 2016-04-23 MED ORDER — TERBUTALINE SULFATE 1 MG/ML IJ SOLN: 0.2500 mg | Freq: Once | INTRAMUSCULAR | Status: DC | PRN

## 2016-04-23 MED ORDER — OXYTOCIN 40 UNITS IN LACTATED RINGERS INFUSION - SIMPLE MED: 1.0000 m[IU]/min | INTRAVENOUS | Status: DC

## 2016-04-23 MED ORDER — ONDANSETRON HCL 4 MG/2ML IJ SOLN: 4.0000 mg | INTRAMUSCULAR | Status: DC | PRN

## 2016-04-23 MED ORDER — ACETAMINOPHEN 325 MG PO TABS
650.0000 mg | ORAL_TABLET | ORAL | Status: DC | PRN
  Administered 2016-04-25 (×2): 650 mg via ORAL
  Filled 2016-04-23 (×2): qty 2

## 2016-04-23 MED ORDER — IBUPROFEN 600 MG PO TABS
600.0000 mg | ORAL_TABLET | Freq: Four times a day (QID) | ORAL | Status: DC
  Administered 2016-04-23 – 2016-04-25 (×9): 600 mg via ORAL
  Filled 2016-04-23 (×9): qty 1

## 2016-04-23 MED ORDER — SENNOSIDES-DOCUSATE SODIUM 8.6-50 MG PO TABS
2.0000 | ORAL_TABLET | ORAL | Status: DC
  Administered 2016-04-23 – 2016-04-24 (×2): 2 via ORAL
  Filled 2016-04-23 (×2): qty 2

## 2016-04-23 MED ORDER — BUPRENORPHINE HCL 8 MG SL SUBL
8.0000 mg | SUBLINGUAL_TABLET | Freq: Two times a day (BID) | SUBLINGUAL | Status: DC
  Administered 2016-04-23 – 2016-04-25 (×5): 8 mg via SUBLINGUAL
  Filled 2016-04-23 (×5): qty 1

## 2016-04-23 MED ORDER — OXYTOCIN 40 UNITS IN LACTATED RINGERS INFUSION - SIMPLE MED
1.0000 m[IU]/min | INTRAVENOUS | Status: DC
  Administered 2016-04-23: 2 m[IU]/min via INTRAVENOUS
  Administered 2016-04-23: 4 m[IU]/min via INTRAVENOUS

## 2016-04-23 MED ORDER — SIMETHICONE 80 MG PO CHEW: 80.0000 mg | CHEWABLE_TABLET | ORAL | Status: DC | PRN

## 2016-04-23 MED ORDER — SODIUM CHLORIDE 0.9 % IV SOLN: 250.0000 mL | INTRAVENOUS | Status: DC | PRN

## 2016-04-23 MED ORDER — SODIUM CHLORIDE 0.9% FLUSH: 3.0000 mL | Freq: Two times a day (BID) | INTRAVENOUS | Status: DC

## 2016-04-23 MED ORDER — SODIUM CHLORIDE 0.9% FLUSH
3.0000 mL | INTRAVENOUS | Status: DC | PRN
Start: 2016-04-23 — End: 2016-04-24

## 2016-04-23 MED ORDER — TETANUS-DIPHTH-ACELL PERTUSSIS 5-2.5-18.5 LF-MCG/0.5 IM SUSP: 0.5000 mL | Freq: Once | INTRAMUSCULAR | Status: DC

## 2016-04-23 MED ORDER — COCONUT OIL OIL
1.0000 "application " | TOPICAL_OIL | Status: DC | PRN
  Administered 2016-04-23: 1 via TOPICAL
  Filled 2016-04-23: qty 120

## 2016-04-23 MED ORDER — ONDANSETRON HCL 4 MG PO TABS: 4.0000 mg | ORAL_TABLET | ORAL | Status: DC | PRN

## 2016-04-23 MED ORDER — BENZOCAINE-MENTHOL 20-0.5 % EX AERO
1.0000 "application " | INHALATION_SPRAY | CUTANEOUS | Status: DC | PRN
  Administered 2016-04-23: 1 via TOPICAL
  Filled 2016-04-23: qty 56

## 2016-04-23 MED ORDER — DIPHENHYDRAMINE HCL 25 MG PO CAPS: 25.0000 mg | ORAL_CAPSULE | Freq: Four times a day (QID) | ORAL | Status: DC | PRN

## 2016-04-23 MED ORDER — WITCH HAZEL-GLYCERIN EX PADS: 1.0000 "application " | MEDICATED_PAD | CUTANEOUS | Status: DC | PRN

## 2016-04-23 MED ORDER — OXYTOCIN 40 UNITS IN LACTATED RINGERS INFUSION - SIMPLE MED: 2.5000 [IU]/h | INTRAVENOUS | Status: DC | PRN

## 2016-04-23 MED ORDER — PRENATAL MULTIVITAMIN CH
1.0000 | ORAL_TABLET | Freq: Every day | ORAL | Status: DC
  Administered 2016-04-23 – 2016-04-25 (×3): 1 via ORAL
  Filled 2016-04-23 (×3): qty 1

## 2016-04-23 MED ORDER — DIBUCAINE 1 % RE OINT: 1.0000 "application " | TOPICAL_OINTMENT | RECTAL | Status: DC | PRN

## 2016-04-23 MED ORDER — ZOLPIDEM TARTRATE 5 MG PO TABS: 5.0000 mg | ORAL_TABLET | Freq: Every evening | ORAL | Status: DC | PRN

## 2016-04-23 NOTE — Anesthesia Rounding Note (Signed)
  CRNA Epidural Rounding Note  Patient: Lamont SnowballKristen D Balducci, 25 y.o., female  Patient's current pain level: 6  Agreed upon pain management level: 5  Epidural intervention: No   Comments: Patient just pushed PCA button and getting pain relief  [facility] CampusEIGHT,Areesha Dehaven 04/23/2016

## 2016-04-23 NOTE — Progress Notes (Signed)
Called for increased pain on her left abdomen. Right side OK. Blood pressure 115/78. Dosed with lidocaine 2% with epinephrine (9 cc). RN at bedside to restart monitoring regimen. Observed for 5 minutes with improvement.

## 2016-04-23 NOTE — Anesthesia Postprocedure Evaluation (Signed)
Anesthesia Post Note  Patient: Alexis SnowballKristen D [name]  Procedure(s) Performed: * No procedures listed *  Patient location during evaluation: [facility] Anesthesia Type: Epidural Level of consciousness: awake and alert Pain management: satisfactory to patient Vital Signs Assessment: post-procedure vital signs reviewed and stable Respiratory status: respiratory function stable Cardiovascular status: stable Postop Assessment: no headache, no backache, epidural receding, patient able to bend at knees, no signs of nausea or vomiting and adequate PO intake Anesthetic complications: no     Last Vitals:  Vitals:   04/23/16 0718 04/23/16 0815  BP: (!) 104/54 114/63  Pulse: 78 85  Resp: 18 18  Temp: 36.7 C 36.7 C    Last Pain:  Vitals:   04/23/16 0815  TempSrc: Oral  PainSc:    Pain Goal: Patients Stated Pain Goal: 1 (04/23/16 0302)               Karleen DolphinFUSSELL,Sonya Gunnoe

## 2016-04-23 NOTE — Consult Note (Signed)
Neonatology Note:   Attendance at Delivery:    I was asked by Dr. Mumaw to attend this vaginal delivery of a 34 0/[redacted] week EGA infant. The mother is a 25 y.o. G3P1102 at [redacted]w[redacted]d admitted for PPROM with multiple doses of Ampicillin.  No maternal chorio concerns.  GBS negative with good prenatal care including treatment for h/o drug abuse with Subutex. Social concerns; see chart. ROM 04/17/16, fluid clear. Infant vigorous with good spontaneous cry and tone. Needed only minimal bulb suctioning. Ap 8/9. Lungs clear to ausc in DR. To NICU due to prematurity. Mom does not desire to breastfeed.   Alexis Witty C. Averly Ericson, MD 

## 2016-04-23 NOTE — Progress Notes (Signed)
CSW received consult for homelessness issues and plans to meet with MOB later today or tomorrow morning to complete assessment as she just delivered this morning.  CSW understands [place] [facility]  CSW notified CPS Theressa Stampsworker/LaTanya Cole [phone] of baby's birth.  She reports that there is a Safety Plan in place that baby can discharge to MOB's care when medically ready.  CPS worker reports that MOB is living with her grandmother/Leah Chartered loss adjusterAuthor and baby is cleared to live in the home also.  CSW provided CPS worker with MOB's two most recent drug screens and will provide her with baby's drug screen results also.

## 2016-04-24 LAB — CBC
HCT: 30.9 % — ABNORMAL LOW (ref 36.0–46.0)
HEMOGLOBIN: 10.7 g/dL — AB (ref 12.0–15.0)
MCH: 32.8 pg (ref 26.0–34.0)
MCHC: 34.6 g/dL (ref 30.0–36.0)
MCV: 94.8 fL (ref 78.0–100.0)
PLATELETS: 150 10*3/uL (ref 150–400)
RBC: 3.26 MIL/uL — AB (ref 3.87–5.11)
RDW: 12.6 % (ref 11.5–15.5)
WBC: 7.3 10*3/uL (ref 4.0–10.5)

## 2016-04-24 NOTE — Progress Notes (Signed)
CSW met with MOB to complete Psychosocial Assessment.  CSW identifies no barriers to discharge.  Full documentation to follow.

## 2016-04-24 NOTE — Progress Notes (Signed)
Patient ID: Alexis [name], female   DOB: 01/21/1991, 25 y.o.   MRN: [id number]   POSTPARTUM PROGRESS NOTE  Post Partum Day #1 Subjective:  Alexis SnowballKristen D [name] is a 25 y.o. [id number] 4082w0d s/p NSVD.  No acute events overnight.  Pt denies problems with ambulating, voiding or po intake.  She denies nausea or vomiting.  Pain is well controlled.  She has had flatus. She has had bowel movement.  Lochia Minimal.   Objective: Blood pressure (!) 94/53, pulse 63, temperature 97.7 F (36.5 C), temperature source Oral, resp. rate 18, height 5\' 11"  (1.803 m), weight 165 lb (74.8 kg), last menstrual period 08/15/2015, SpO2 98 %, unknown if currently breastfeeding.  Physical Exam:  General: alert, cooperative and no distress Lochia:normal flow Chest: CTAB Heart: RRR no m/r/g Abdomen: +BS, soft, nontender,  Uterine Fundus: firm, below umbilicus DVT Evaluation: No calf swelling or tenderness Extremities: No edema   Recent Labs  04/22/16 0805 04/24/16 0511  HGB 11.2* 10.7*  HCT 32.7* 30.9*    Assessment/Plan:  ASSESSMENT: Alexis SnowballKristen D [name] is a 25 y.o. [id number] 2382w0d s/p NSVD.  Plan for discharge tomorrow, Social Work consult and Contraception Nexplanon.  Baby is in NICU   LOS: 7 days   Jen MowElizabeth Lanorris Kalisz, DO 04/24/2016, 9:33 AM

## 2016-04-24 NOTE — Progress Notes (Signed)
CSW met briefly with MOB at baby's bedside to introduce services and request time to meet with MOB in her third floor room in order to complete Psychosocial Assessment.  MOB states she is doing well other than not sleeping.  She states plans to return to her room after lunch to rest and asked that CSW check in with her this afternoon.  CSW agrees.

## 2016-04-24 NOTE — Lactation Note (Signed)
This note was copied from a baby's chart. Lactation Consultation Note  Patient Name: Boy Renold DonKristen Mirando [id number]'s Date: 04/24/2016   NICU baby 30 hours old. Mom reports that this is her third child, she is aware of the benefits of BF, but she does not want to nurse/pump. Provided anticipatory guidance, and mom reported that she has dry/crusty breast milk that has dripped from her breast, and she intends to use coconut oil--at bedside--on her nipples. Mom is aware of OP/BFSG and LC phone line assistance after D/C.   Maternal Data    Feeding Feeding Type: Formula Length of feed: 60 min  LATCH Score/Interventions                      Lactation Tools Discussed/Used     Consult Status      Sherlyn HayJennifer D Lucita Montoya 04/24/2016, 11:39 AM

## 2016-04-25 MED ORDER — ACETAMINOPHEN 325 MG PO TABS: 650.0000 mg | ORAL_TABLET | ORAL | 1 refills | Status: DC | PRN

## 2016-04-25 MED ORDER — SENNOSIDES-DOCUSATE SODIUM 8.6-50 MG PO TABS: 2.0000 | ORAL_TABLET | ORAL | 1 refills | Status: DC

## 2016-04-25 MED ORDER — IBUPROFEN 600 MG PO TABS: 600.0000 mg | ORAL_TABLET | Freq: Four times a day (QID) | ORAL | 0 refills | Status: DC

## 2016-04-25 NOTE — Discharge Summary (Signed)
OB Discharge Summary     Patient Name: Alexis [name] DOB: 1990/12/05 MRN: [id number]  Date of admission: 04/17/2016 Delivering MD: Michaele Offer   Date of discharge: 04/25/2016  Admitting diagnosis: 32WKS,WATER BROKE Intrauterine pregnancy: [redacted]w[redacted]d     Secondary diagnosis:  Principal Problem:   Preterm premature rupture of membranes (PPROM) at [redacted]w[redacted]d with unknown onset of labor Active Problems:   Pregnancy complicated by subutex maintenance, antepartum (HCC)  Additional problems: None     Discharge diagnosis: Preterm Pregnancy Delivered                                                                                                Post partum procedures: None  Augmentation: Pitocin  Complications: None and ROM>24 hours  Hospital course:  Onset of Labor With Vaginal Delivery     25 y.o. yo [id number] at [redacted]w[redacted]d was admitted in Active Labor on 04/17/2016 with PPROM. Patient was given betamethasone, latency antibiotics, and monitored until [redacted] weeks EGA, with an uncomplicated antepartum course. At [redacted] weeks EGA, patient had labor induced. Patient had an uncomplicated labor course as follows:  Membrane Rupture Time/Date: 4:00 AM ,04/17/2016   Intrapartum Procedures: Episiotomy: None [1]                                         Lacerations:  None [1]  Patient had a delivery of a Viable infant. 04/23/2016  Information for the patient's newborn:  Kita, Neace [[id number]]  Delivery Method: Vaginal, Spontaneous Delivery (Filed from Delivery Summary)    Pateint had an uncomplicated postpartum course.  She is ambulating, tolerating a regular diet, passing flatus, and urinating well. Patient is discharged home in stable condition on 04/25/16.    Physical exam Vitals:   04/24/16 1155 04/24/16 1829 04/24/16 2206 04/25/16 0513  BP: 97/60 107/62 (!) 96/59 (!) 100/56  Pulse: 71 82 70 65  Resp: 18 16 18 16   Temp: 98 F (36.7 C) 98.4 F (36.9 C) 98.3 F (36.8 C) 98.1 F  (36.7 C)  TempSrc: Oral Oral Oral Oral  SpO2: 100% 100% 99% 96%  Weight:      Height:       General: alert, cooperative and no distress Lochia: appropriate Uterine Fundus: firm, below umbilicus Incision: N/A DVT Evaluation: No evidence of DVT seen on physical exam. Negative Homan's sign. No cords or calf tenderness. Labs: Lab Results  Component Value Date   WBC 7.3 04/24/2016   HGB 10.7 (L) 04/24/2016   HCT 30.9 (L) 04/24/2016   MCV 94.8 04/24/2016   PLT 150 04/24/2016   CMP Latest Ref Rng & Units 12/11/2015  Glucose 65 - 99 mg/dL 89  BUN 6 - 20 mg/dL 10  Creatinine 1.30 - 8.65 mg/dL 7.84  Sodium 696 - 295 mmol/L 137  Potassium 3.5 - 5.1 mmol/L 3.6  Chloride 101 - 111 mmol/L 103  CO2 22 - 32 mmol/L 24  Calcium 8.9 - 10.3 mg/dL 9.2  Total Protein 6.5 - 8.1 g/dL  7.5  Total Bilirubin 0.3 - 1.2 mg/dL 0.8  Alkaline Phos 38 - 126 U/L 51  AST 15 - 41 U/L 15  ALT 14 - 54 U/L 11(L)    Discharge instruction: per After Visit Summary and "Baby and Me Booklet".  After visit meds:    Medication List    TAKE these medications   acetaminophen 325 MG tablet Commonly known as:  TYLENOL Take 2 tablets (650 mg total) by mouth every 4 (four) hours as needed (for pain scale < 4).   buprenorphine 8 MG Subl SL tablet Commonly known as:  SUBUTEX Place 8 mg under the tongue 2 (two) times daily.   ibuprofen 600 MG tablet Commonly known as:  ADVIL,MOTRIN Take 1 tablet (600 mg total) by mouth every 6 (six) hours.   multivitamin tablet Take 1 tablet by mouth daily.   promethazine 25 MG tablet Commonly known as:  PHENERGAN Take 1 tablet (25 mg total) by mouth every 6 (six) hours as needed for nausea or vomiting.   senna-docusate 8.6-50 MG tablet Commonly known as:  Senokot-S Take 2 tablets by mouth daily.       Diet: routine diet  Activity: Advance as tolerated. Pelvic rest for 6 weeks.   Outpatient follow up:6 weeks Follow up Appt:No future appointments. Follow up  Visit:No Follow-up on file.  Postpartum contraception: Nexplanon  Newborn Data: Live born female  Birth Weight: 4 lb 14 oz (2210 g) APGAR: 8, 9  Baby Feeding: Bottle Disposition:NICU   04/25/2016 Jen MowElizabeth Mumaw, DO

## 2016-04-25 NOTE — Progress Notes (Signed)
Discharge teaching complete. Pt understood all information and did not have any questions. Pt ambulated out of the hospital and discharged home to family.  

## 2016-04-25 NOTE — Discharge Instructions (Signed)
Postpartum Care After Vaginal Delivery °After you deliver your newborn (postpartum period), the usual stay in the hospital is 24-72 hours. If there were problems with your labor or delivery, or if you have other medical problems, you might be in the hospital longer.  °While you are in the hospital, you will receive help and instructions on how to care for yourself and your newborn during the postpartum period.  °While you are in the hospital: °· Be sure to tell your nurses if you have pain or discomfort, as well as where you feel the pain and what makes the pain worse. °· If you had an incision made near your vagina (episiotomy) or if you had some tearing during delivery, the nurses may put ice packs on your episiotomy or tear. The ice packs may help to reduce the pain and swelling. °· If you are breastfeeding, you may feel uncomfortable contractions of your uterus for a couple of weeks. This is normal. The contractions help your uterus get back to normal size. °· It is normal to have some bleeding after delivery. °¨ For the first 1-3 days after delivery, the flow is red and the amount may be similar to a period. °¨ It is common for the flow to start and stop. °¨ In the first few days, you may pass some small clots. Let your nurses know if you begin to pass large clots or your flow increases. °¨ Do not  flush blood clots down the toilet before having the nurse look at them. °¨ During the next 3-10 days after delivery, your flow should become more watery and pink or brown-tinged in color. °¨ Ten to fourteen days after delivery, your flow should be a small amount of yellowish-white discharge. °¨ The amount of your flow will decrease over the first few weeks after delivery. Your flow may stop in 6-8 weeks. Most women have had their flow stop by 12 weeks after delivery. °· You should change your sanitary pads frequently. °· Wash your hands thoroughly with soap and water for at least 20 seconds after changing pads, using  the toilet, or before holding or feeding your newborn. °· You should feel like you need to empty your bladder within the first 6-8 hours after delivery. °· In case you become weak, lightheaded, or faint, call your nurse before you get out of bed for the first time and before you take a shower for the first time. °· Within the first few days after delivery, your breasts may begin to feel tender and full. This is called engorgement. Breast tenderness usually goes away within 48-72 hours after engorgement occurs. You may also notice milk leaking from your breasts. If you are not breastfeeding, do not stimulate your breasts. Breast stimulation can make your breasts produce more milk. °· Spending as much time as possible with your newborn is very important. During this time, you and your newborn can feel close and get to know each other. Having your newborn stay in your room (rooming in) will help to strengthen the bond with your newborn.  It will give you time to get to know your newborn and become comfortable caring for your newborn. °· Your hormones change after delivery. Sometimes the hormone changes can temporarily cause you to feel sad or tearful. These feelings should not last more than a few days. If these feelings last longer than that, you should talk to your caregiver. °· If desired, talk to your caregiver about methods of family planning or contraception. °·   Talk to your caregiver about immunizations. Your caregiver may want you to have the following immunizations before leaving the hospital: °¨ Tetanus, diphtheria, and pertussis (Tdap) or tetanus and diphtheria (Td) immunization. It is very important that you and your family (including grandparents) or others caring for your newborn are up-to-date with the Tdap or Td immunizations. The Tdap or Td immunization can help protect your newborn from getting ill. °¨ Rubella immunization. °¨ Varicella (chickenpox) immunization. °¨ Influenza immunization. You should  receive this annual immunization if you did not receive the immunization during your pregnancy. °  °This information is not intended to replace advice given to you by your health care provider. Make sure you discuss any questions you have with your health care provider. °  °Document Released: 05/18/2007 Document Revised: 04/14/2012 Document Reviewed: 03/17/2012 °Elsevier Interactive Patient Education ©2016 Elsevier Inc. ° °Places to have your son circumcised:                                                                      °[facility]           [phone]   $480 by 4 wks             °[facility]                [phone]   $244 by 4 wks             °[facility]               [phone]   $175 by 2 wks             °[facility]                      [phone]   $250 by 7 days °[facility]                     [phone]   $150 by 4 wks ° °These prices sometimes change but are roughly what you can expect to pay. Please call and confirm pricing.  ° °Circumcision is considered an elective/non-medically necessary procedure. There are many reasons parents decide to have their sons circumsized. During the first year of life circumcised males have a reduced risk of urinary tract infections but after this year the rates between circumcised males and uncircumcised males are the same.  It is safe to have your son circumcised outside of the hospital and the places above perform them regularly.  ° °

## 2016-04-26 LAB — TYPE AND SCREEN
ABO/RH(D): A NEG
Antibody Screen: POSITIVE
DAT, IgG: NEGATIVE
Unit division: 0
Unit division: 0
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
## 2016-08-15 ENCOUNTER — Encounter: Payer: Self-pay | Admitting: Obstetrics & Gynecology

## 2016-08-20 ENCOUNTER — Telehealth (HOSPITAL_COMMUNITY): Payer: Self-pay | Admitting: *Deleted

## 2016-08-20 NOTE — Telephone Encounter (Signed)
Office received ref from Dr. Felecia ShellingFanta office to schedule new pt appt for pt. Called [phone] and spoke with a female that stated pt is not here at this time. Staff asked for a better number to get a hold of pt and female stated to call pt on her cell phone number. Called cell number [phone]) that was provided on ref and message came up stating the caller you are trying to reach can not receive calls at this time.
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
## 2017-11-19 ENCOUNTER — Other Ambulatory Visit: Payer: Self-pay

## 2017-11-19 ENCOUNTER — Encounter (HOSPITAL_COMMUNITY): Payer: Self-pay | Admitting: Emergency Medicine

## 2017-11-19 ENCOUNTER — Emergency Department (HOSPITAL_COMMUNITY): Payer: Medicaid Other

## 2017-11-19 ENCOUNTER — Emergency Department (HOSPITAL_COMMUNITY)
Admission: EM | Admit: 2017-11-19 | Discharge: 2017-11-20 | Disposition: A | Discharge status: Home / Self Care | Payer: Medicaid Other | Attending: Emergency Medicine | Admitting: Emergency Medicine

## 2017-11-19 DIAGNOSIS — F329 Major depressive disorder, single episode, unspecified: Secondary | ICD-10-CM | POA: Insufficient documentation

## 2017-11-19 DIAGNOSIS — Y92199 Unspecified place in other specified residential institution as the place of occurrence of the external cause: Secondary | ICD-10-CM | POA: Diagnosis not present

## 2017-11-19 DIAGNOSIS — S51812A Laceration without foreign body of left forearm, initial encounter: Secondary | ICD-10-CM | POA: Insufficient documentation

## 2017-11-19 DIAGNOSIS — F17210 Nicotine dependence, cigarettes, uncomplicated: Secondary | ICD-10-CM | POA: Insufficient documentation

## 2017-11-19 DIAGNOSIS — F418 Other specified anxiety disorders: Secondary | ICD-10-CM | POA: Diagnosis present

## 2017-11-19 DIAGNOSIS — W2209XA Striking against other stationary object, initial encounter: Secondary | ICD-10-CM | POA: Insufficient documentation

## 2017-11-19 DIAGNOSIS — F32A Depression, unspecified: Secondary | ICD-10-CM

## 2017-11-19 DIAGNOSIS — S60221A Contusion of right hand, initial encounter: Secondary | ICD-10-CM | POA: Diagnosis not present

## 2017-11-19 DIAGNOSIS — Y999 Unspecified external cause status: Secondary | ICD-10-CM | POA: Diagnosis not present

## 2017-11-19 DIAGNOSIS — Y939 Activity, unspecified: Secondary | ICD-10-CM | POA: Diagnosis not present

## 2017-11-19 DIAGNOSIS — Z79899 Other long term (current) drug therapy: Secondary | ICD-10-CM | POA: Diagnosis not present

## 2017-11-19 LAB — COMPREHENSIVE METABOLIC PANEL
ALBUMIN: 4.1 g/dL (ref 3.5–5.0)
ALK PHOS: 65 U/L (ref 38–126)
ALT: 14 U/L (ref 14–54)
ANION GAP: 9 (ref 5–15)
AST: 18 U/L (ref 15–41)
BUN: 9 mg/dL (ref 6–20)
CALCIUM: 9.2 mg/dL (ref 8.9–10.3)
CO2: 25 mmol/L (ref 22–32)
Chloride: 105 mmol/L (ref 101–111)
Creatinine, Ser: 0.8 mg/dL (ref 0.44–1.00)
GFR calc Af Amer: >60 mL/min (ref >60)
GLUCOSE: 96 mg/dL (ref 65–99)
POTASSIUM: 4.5 mmol/L (ref 3.5–5.1)
Sodium: 139 mmol/L (ref 135–145)
Total Bilirubin: 0.4 mg/dL (ref 0.3–1.2)
Total Protein: 7.1 g/dL (ref 6.5–8.1)

## 2017-11-19 LAB — CBC WITH DIFFERENTIAL/PLATELET
BASOS PCT: 1 %
Basophils Absolute: 0 10*3/uL (ref 0.0–0.1)
Eosinophils Absolute: 0.3 10*3/uL (ref 0.0–0.7)
Eosinophils Relative: 5 %
HCT: 38.4 % (ref 36.0–46.0)
HEMOGLOBIN: 12.6 g/dL (ref 12.0–15.0)
LYMPHS PCT: 27 %
Lymphs Abs: 1.7 10*3/uL (ref 0.7–4.0)
MCH: 31.3 pg (ref 26.0–34.0)
MCHC: 32.8 g/dL (ref 30.0–36.0)
MCV: 95.5 fL (ref 78.0–100.0)
MONO ABS: 0.5 10*3/uL (ref 0.1–1.0)
MONOS PCT: 7 %
NEUTROS PCT: 60 %
Neutro Abs: 4 10*3/uL (ref 1.7–7.7)
Platelets: 215 10*3/uL (ref 150–400)
RBC: 4.02 MIL/uL (ref 3.87–5.11)
RDW: 12.3 % (ref 11.5–15.5)
WBC: 6.5 10*3/uL (ref 4.0–10.5)

## 2017-11-19 LAB — ETHANOL

## 2017-11-19 MED ORDER — LORAZEPAM 1 MG PO TABS
1.0000 mg | ORAL_TABLET | Freq: Once | ORAL | Status: AC
  Administered 2017-11-19: 1 mg via ORAL
  Filled 2017-11-19: qty 1

## 2017-11-19 NOTE — ED Triage Notes (Addendum)
Pt states she stays at "[facility] in [place]. Pt states that she left to see her daughter and when she tried to come back they told her she would not be let back in because of her relapse on cocaine. Pt states that she did cut herself on her left arm. Bleeding controlled. Pt denies SI/HI at this time. But stated that she was earlier today. Pt also reports punching a wall today.

## 2017-11-20 ENCOUNTER — Encounter (HOSPITAL_COMMUNITY): Payer: Self-pay | Admitting: Registered Nurse

## 2017-11-20 LAB — URINALYSIS, ROUTINE W REFLEX MICROSCOPIC
BILIRUBIN URINE: NEGATIVE
GLUCOSE, UA: NEGATIVE mg/dL
Hgb urine dipstick: NEGATIVE
Ketones, ur: NEGATIVE mg/dL
Leukocytes, UA: NEGATIVE
Nitrite: NEGATIVE
PROTEIN: NEGATIVE mg/dL
Specific Gravity, Urine: 1.019 (ref 1.005–1.030)
pH: 6 (ref 5.0–8.0)

## 2017-11-20 LAB — PREGNANCY, URINE: PREG TEST UR: NEGATIVE

## 2017-11-20 LAB — RAPID URINE DRUG SCREEN, HOSP PERFORMED
Amphetamines: NOT DETECTED
BARBITURATES: NOT DETECTED
BENZODIAZEPINES: POSITIVE — AB
COCAINE: POSITIVE — AB
Opiates: NOT DETECTED
Tetrahydrocannabinol: POSITIVE — AB

## 2017-11-20 NOTE — ED Notes (Signed)
Pt has attempted several times to call [facility] with no success. Nurse has also called the facility and no one has answered the phone calls.

## 2017-11-20 NOTE — Discharge Instructions (Signed)
[facility] another behavior health place

## 2017-11-20 NOTE — ED Notes (Addendum)
Called [facility] patient [facility]. When told that pt would be discharged and if the facility would accept the patient back Alexis [name] stated that they would take her back. But as the conversation continued, [name] stated that he does not think she should be discharged and that they will "not accept her back until she gets help". Pt cleared for physical wellbeing. Pt does not meet criteria [facility]. EDP notified  Alexis [name]  [phone]

## 2017-11-20 NOTE — ED Notes (Signed)
Per EDP Knapp, Pt no longer needs SI sitter at bedside
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
## 2017-11-20 NOTE — Consult Note (Signed)
  Initial Psychiatric Tele Assessment   Alexis SnowballKristen D [name], 27 y.o., female patient presented to [facility] with complaints of depression and anxiety.  At initial admission to ED patient denied suicidal/homicidal ideation and auditory/visual hallucinations.  Patient seen via telepsych by this provider; chart reviewed and consulted with Dr. Lucianne MussKumar on 11/20/17.    Alexis [name] had been discharged related to earlier assessment where patient had been psychiatrically cleared related to not meeting criteria for inpatient psychiatric treatment.   Nursing also stated that patient was living in a half way house.   [name], NP

## 2017-11-20 NOTE — ED Provider Notes (Signed)
[facility] EMERGENCY DEPARTMENT Provider Note   CSN: [id number] Arrival date & time: 11/19/17  2141     History   Chief Complaint Chief Complaint  Patient presents with  . V70.1    HPI Alexis [name] is a 27 y.o. female.  Level 5 caveat for psychiatric illness and urgent need for intervention.  Patient allegedly stays at a halfway house called [facility] in [place], [place].  She was transferred there after an inpatient admission to [facility] in [place].  History is sketchy, but apparently today she left the house without permission in an attempt to see her daughter.  She also "smoked some weed which was laced with cocaine".  She hit her right hand on a door in anger.  Additionally, she had some superficial lacerations to the anterior aspect of her left forearm.  She states she is anxious and restless, but not suicidal.  Past medical history includes anxiety, depression, PTSD, OCD, substance abuse.     Past Medical History:  Diagnosis Date  . Anxiety   . Depression   . Substance abuse [facility])     Patient Active Problem List   Diagnosis Date Noted  . Pregnancy complicated by subutex maintenance, antepartum (HCC) 04/18/2016  . Preterm premature rupture of membranes (PPROM) at [redacted]w[redacted]d with unknown onset of labor 04/17/2016    Past Surgical History:  Procedure Laterality Date  . NO PAST SURGERIES    . WISDOM TOOTH EXTRACTION       OB History    Gravida  3   Para  3   Term  1   Preterm  2   AB      Living  3     SAB      TAB      Ectopic      Multiple  0   Live Births  3            Home Medications    Prior to Admission medications   Medication Sig Start Date End Date Taking? Authorizing Provider  acetaminophen (TYLENOL) 325 MG tablet Take 2 tablets (650 mg total) by mouth every 4 (four) hours as needed (for pain scale < 4). 04/25/16   Mumaw, Hiram Comber, DO  buprenorphine (SUBUTEX) 8 MG SUBL SL tablet Place  8 mg under the tongue 2 (two) times daily.    [provider]  ibuprofen (ADVIL,MOTRIN) 600 MG tablet Take 1 tablet (600 mg total) by mouth every 6 (six) hours. 04/25/16   Mumaw, Hiram Comber, DO  Multiple Vitamin (MULTIVITAMIN) tablet Take 1 tablet by mouth daily.    [provider]  promethazine (PHENERGAN) 25 MG tablet Take 1 tablet (25 mg total) by mouth every 6 (six) hours as needed for nausea or vomiting. Patient not taking: Reported on 04/17/2016 12/11/15   Emily Filbert, MD  senna-docusate (SENOKOT-S) 8.6-50 MG tablet Take 2 tablets by mouth daily. 04/26/16   Mumaw, Hiram Comber, DO    Family History No family history on file.  Social History Social History   Tobacco Use  . Smoking status: Current Some Day Smoker    Packs/day: 0.50    Years: 10.00    Pack years: 5.00    Types: Cigarettes  . Smokeless tobacco: Current User  Substance Use Topics  . Alcohol use: No  . Drug use: Yes    Types: Heroin, Cocaine, Marijuana    Comment: Pt used cocaine 4 days ago  Allergies   Naloxone; Tramadol; and Vicodin [hydrocodone-acetaminophen]   Review of Systems Review of Systems  Unable to perform ROS: Psychiatric disorder     Physical Exam Updated Vital Signs BP (!) 160/87 (BP Location: Right Arm)   Pulse (!) 103   Temp 98.2 F (36.8 C) (Oral)   Resp (!) 22   Wt 74.8 kg (165 lb)   LMP  (LMP Unknown)   SpO2 99%   BMI 23.01 kg/m   Physical Exam  Constitutional: She is oriented to person, place, and time. She appears well-developed and well-nourished.  HENT:  Head: Normocephalic and atraumatic.  Eyes: Conjunctivae are normal.  Neck: Neck supple.  Cardiovascular: Normal rate and regular rhythm.  Pulmonary/Chest: Effort normal and breath sounds normal.  Abdominal: Soft. Bowel sounds are normal.  Musculoskeletal:  Tender on the dorsum of the right hand  Neurological: She is alert and oriented to person, place, and time.  Skin:    Superficial lacerations anterior aspect left forearm  Psychiatric: She has a normal mood and affect. Her behavior is normal.  Nursing note and vitals reviewed.    ED Treatments / Results  Labs (all labs ordered are listed, but only abnormal results are displayed) Labs Reviewed  CBC WITH DIFFERENTIAL/PLATELET  COMPREHENSIVE METABOLIC PANEL  ETHANOL  RAPID URINE DRUG SCREEN, HOSP PERFORMED  URINALYSIS, ROUTINE W REFLEX MICROSCOPIC  PREGNANCY, URINE    EKG None  Radiology Dg Hand Complete Right  Result Date: 11/19/2017 CLINICAL DATA:  Acute onset of right hand pain and bruising across the dorsal third metacarpophalangeal joint, after punching a wall. Initial encounter. EXAM: RIGHT HAND - COMPLETE 3+ VIEW COMPARISON:  None. FINDINGS: There is no evidence of fracture or dislocation. The joint spaces are preserved. The carpal rows are intact, and demonstrate normal alignment. There is mild chronic deformity of the fifth metacarpal. The soft tissues are unremarkable in appearance. IMPRESSION: No evidence of fracture or dislocation. Electronically Signed   By: Roanna RaiderJeffery  Chang M.D.   On: 11/19/2017 22:34    Procedures Procedures (including critical care time)  Medications Ordered in ED Medications  LORazepam (ATIVAN) tablet 1 mg (1 mg Oral Given 11/19/17 2346)     Initial Impression / Assessment and Plan / ED Course  I have reviewed the triage vital signs and the nursing notes.  Pertinent labs & imaging results that were available during my care of the patient were reviewed by me and considered in my medical decision making (see chart for details).     Patient presents with agitation, restlessness after being evicted from her halfway house.  Additionally she struck her right hand and made some superficial lacerations to her left forearm.  She apparently smokes some marijuana.  Will consult behavioral health for further guidance.  Final Clinical Impressions(s) / ED Diagnoses    Final diagnoses:  Depression, unspecified depression type  Contusion of right hand, initial encounter  Laceration of left forearm, initial encounter    ED Discharge Orders    None       Donnetta Hutchingook, Latrisa Hellums, MD 11/20/17 [phone]

## 2017-11-20 NOTE — ED Provider Notes (Signed)
Patient was left at change of shift awaiting TTS consult.  TTS recommends patient does not meet inpatient criteria for admission.  They command releasing her back to her halfway house.  Heather, nursing called patient's halfway house.  They initially stated they would accept her back and then stated that they were concerned she was going to harm herself so they refused to take her back.  I talked to the patient at 2:10 AM.  She was informed that the mental health evaluation was that she did not need to be admitted to psychiatric hospital.  She states that she did make a comment at her halfway house that they made her feel worthless and "I might as will be gone".  She denies making a suicidal statement or having a plan.  She states she wants to go back to the halfway house.  She was given a phone to talk to them to see if they will take her back.  Her contact at the halfway house isn't answering the phone, she will stay in the ED until morning when she can try to call them again.      Devoria AlbeKnapp, Stephon Weathers, MD 11/20/17 [phone]

## 2017-12-22 NOTE — Congregational Nurse Program (Signed)
Congregational Nurse Program Note  Date of Encounter: 12/21/2017  Past Medical History: Past Medical History:  Diagnosis Date  . Anxiety   . Depression   . Substance abuse [facility])     Encounter Details: CNP Questionnaire - 12/21/17 1206      Questionnaire   Patient Status  Not Applicable    Race  White or Caucasian    Location Patient Served At  [facility]  Medicaid    Uninsured  Not Applicable    Food  No food insecurities    Housing/Utilities  Yes, have permanent housing    Transportation  No transportation needs    Interpersonal Safety  Yes, feel physically and emotionally safe where you currently live    Medication  Yes, have medication insecurities    Medical Provider  No    Referrals  Area Agency    ED Visit Averted  Not Applicable    Life-Saving Intervention Made  Not Applicable      Client was seen at [facility] with questions regarding medication prescribed for drug addiction. Client states having a history of drug addiction and mental illness issues. Client is currently living At a Recovery House in [facility] and is a patient at [facility] in [place], [place]. Instructed client to address questions regarding these medications with physician who prescribed medications.  Client was Given information Chief Executive Officer) from [facility] in [place].  Instructed client to call for appointment ( has [employer] ins.) was given card from [facility] with phone number to call If any other questions or concerns.  Pearletha Alfred  [phone].

## 2018-01-11 ENCOUNTER — Ambulatory Visit (INDEPENDENT_AMBULATORY_CARE_PROVIDER_SITE_OTHER): Payer: Medicaid Other | Admitting: Obstetrics and Gynecology

## 2018-01-11 ENCOUNTER — Other Ambulatory Visit (HOSPITAL_COMMUNITY)
Admission: RE | Admit: 2018-01-11 | Discharge: 2018-01-11 | Disposition: A | Discharge status: Home / Self Care | Payer: Medicaid Other | Source: Ambulatory Visit | Attending: Obstetrics and Gynecology | Admitting: Obstetrics and Gynecology

## 2018-01-11 ENCOUNTER — Other Ambulatory Visit: Payer: Self-pay

## 2018-01-11 ENCOUNTER — Encounter

## 2018-01-11 ENCOUNTER — Encounter: Payer: Self-pay | Admitting: Obstetrics and Gynecology

## 2018-01-11 VITALS — BP 108/65 | HR 93 | Ht 71.0 in | Wt 199.0 lb

## 2018-01-11 DIAGNOSIS — Z124 Encounter for screening for malignant neoplasm of cervix: Secondary | ICD-10-CM | POA: Diagnosis not present

## 2018-01-11 DIAGNOSIS — B9689 Other specified bacterial agents as the cause of diseases classified elsewhere: Secondary | ICD-10-CM | POA: Diagnosis not present

## 2018-01-11 DIAGNOSIS — Z113 Encounter for screening for infections with a predominantly sexual mode of transmission: Secondary | ICD-10-CM | POA: Diagnosis not present

## 2018-01-11 DIAGNOSIS — R110 Nausea: Secondary | ICD-10-CM

## 2018-01-11 DIAGNOSIS — N926 Irregular menstruation, unspecified: Secondary | ICD-10-CM | POA: Diagnosis not present

## 2018-01-11 DIAGNOSIS — N898 Other specified noninflammatory disorders of vagina: Secondary | ICD-10-CM | POA: Diagnosis not present

## 2018-01-11 DIAGNOSIS — N760 Acute vaginitis: Secondary | ICD-10-CM

## 2018-01-11 DIAGNOSIS — Z0001 Encounter for general adult medical examination with abnormal findings: Secondary | ICD-10-CM | POA: Diagnosis not present

## 2018-01-11 LAB — POCT WET PREP (WET MOUNT): TRICHOMONAS WET PREP HPF POC: ABSENT

## 2018-01-11 NOTE — Progress Notes (Signed)
Patient ID: Alexis SnowballKristen D [name], female   DOB: 11/16/1990, 27 y.o.   MRN: [id number]    [facility]  @DATE @            Patient name: Alexis [name] MRN [id number]  Date of birth: 11/16/1990  CC & HPI:  Alexis [name] is a 27 y.o. female presenting today for check up and  Patient has not eaten today but would like labs done. Pt is sexually active and has birth control Nexplanon after she had her 29 year old son vaginally. Pt went to the doctor a year ago and a cyst was found on her ovaries noncancerous. Breast cancer and ovarian cancer runs in patients family. Her grandmother had breast cancer.   Patient denies fever, chills or any other symptoms or complaints at this time.  ROS:  ROS +spotting +irregular menstrual cycle +mild nausea All systems are negative except as noted in the HPI and PMH.  Social history significant the [facility] [place].  She does not have custody of her children, 2 children are with their father's and one child is with the patient's mother Contraception: Nexplanon this is YEAR two. Pertinent History Reviewed:   Reviewed: Significant for none Medical         Past Medical History:  Diagnosis Date  . Anxiety   . Depression   . Substance abuse [facility](HCC)                               Surgical Hx:    Past Surgical History:  Procedure Laterality Date  . NO PAST SURGERIES    . WISDOM TOOTH EXTRACTION     Medications: Reviewed & Updated - see associated section                       Current Outpatient Medications:  .  acetaminophen (TYLENOL) 325 MG tablet, Take 2 tablets (650 mg total) by mouth every 4 (four) hours as needed (for pain scale < 4)., Disp: 60 tablet, Rfl: 1 .  buprenorphine (SUBUTEX) 8 MG SUBL SL tablet, Place 8 mg under the tongue 2 (two) times daily., Disp: , Rfl:  .  ibuprofen (ADVIL,MOTRIN) 600 MG tablet, Take 1 tablet (600 mg total) by mouth every 6 (six) hours., Disp: 30  tablet, Rfl: 0 .  Multiple Vitamin (MULTIVITAMIN) tablet, Take 1 tablet by mouth daily., Disp: , Rfl:  .  promethazine (PHENERGAN) 25 MG tablet, Take 1 tablet (25 mg total) by mouth every 6 (six) hours as needed for nausea or vomiting. (Patient not taking: Reported on 04/17/2016), Disp: 20 tablet, Rfl: 0 .  senna-docusate (SENOKOT-S) 8.6-50 MG tablet, Take 2 tablets by mouth daily., Disp: 30 tablet, Rfl: 1   Social History: Reviewed -  reports that she has been smoking cigarettes.  She has a 5.00 pack-year smoking history. She uses smokeless tobacco.  Pt used to use heroin and had slip up and used cocaine after medication could not be refilled. Self quit and has been clean for about 5 months  Objective Findings:  Vitals: unknown if currently breastfeeding.  PHYSICAL EXAMINATION General appearance - alert, well appearing, and in no distress and oriented to person, place, and time Mental status - alert, oriented to person, place, and time, normal mood, behavior, speech, dress, motor activity, and thought processes, affect appropriate to mood Chest - clear to  auscultation, no wheezes, rales or rhonchi, symmetric air entry Heart - normal rate, regular rhythm, normal S1, S2, no murmurs, rubs, clicks or gallops Breast exam done counseling over breast self-exam and importance of mammograms at age 83 PELVIC External genitalia -normal female Vulva -unremarkable multiparous Vagina -light discharge first-degree uterine descensus Cervix -multiparous  Uterus - retroverted Adnexa -no masses nontender Wet Mount -negative tric occasional clue cell, no yeast Rectal - normal rectal, no masses, rectal exam not indicated    Assessment & Plan:   A:  1. Well woman exam with Pap 2. Bacterial vaginosis 3. STI screen including labs  P:  1. Return 1 year regarding Nexplanon and reinsertion 2. Metronidazole x7 days Check HIV RPR hepatitis C, hepatitis B   By signing my name below, I, Arnette Norris,  attest that this documentation has been prepared under the direction and in the presence of Tilda Burrow, MD. Electronically Signed: Arnette Norris Medical Scribe. 01/11/18. 9:55 AM.  I personally performed the services described in this documentation, which was SCRIBED in my presence. The recorded information has been reviewed and considered accurate. It has been edited as necessary during review. Tilda Burrow, MD

## 2018-01-11 NOTE — Addendum Note (Signed)
Addended by: Tish FredericksonLANCASTER, Mirage Pfefferkorn A on: 01/11/2018 12:19 PM   Modules accepted: Orders

## 2018-01-12 LAB — CBC
HEMATOCRIT: 42 % (ref 34.0–46.6)
HEMOGLOBIN: 13.9 g/dL (ref 11.1–15.9)
MCH: 31.5 pg (ref 26.6–33.0)
MCHC: 33.1 g/dL (ref 31.5–35.7)
MCV: 95 fL (ref 79–97)
Platelets: 242 10*3/uL (ref 150–450)
RBC: 4.41 x10E6/uL (ref 3.77–5.28)
RDW: 13.9 % (ref 12.3–15.4)
WBC: 6.5 10*3/uL (ref 3.4–10.8)

## 2018-01-12 LAB — COMPREHENSIVE METABOLIC PANEL
ALK PHOS: 80 IU/L (ref 39–117)
ALT: 43 IU/L — AB (ref 0–32)
AST: 29 IU/L (ref 0–40)
Albumin/Globulin Ratio: 1.6 (ref 1.2–2.2)
Albumin: 4.3 g/dL (ref 3.5–5.5)
BUN/Creatinine Ratio: 17 (ref 9–23)
BUN: 14 mg/dL (ref 6–20)
Bilirubin Total: 0.4 mg/dL (ref 0.0–1.2)
CHLORIDE: 102 mmol/L (ref 96–106)
CO2: 23 mmol/L (ref 20–29)
Calcium: 9.5 mg/dL (ref 8.7–10.2)
Creatinine, Ser: 0.84 mg/dL (ref 0.57–1.00)
GFR calc non Af Amer: 94 mL/min/{1.73_m2} (ref >59)
GFR, EST AFRICAN AMERICAN: 109 mL/min/{1.73_m2} (ref >59)
GLUCOSE: 71 mg/dL (ref 65–99)
Globulin, Total: 2.7 g/dL (ref 1.5–4.5)
Potassium: 4.9 mmol/L (ref 3.5–5.2)
Sodium: 140 mmol/L (ref 134–144)
TOTAL PROTEIN: 7 g/dL (ref 6.0–8.5)

## 2018-01-12 LAB — HIV ANTIBODY (ROUTINE TESTING W REFLEX): HIV SCREEN 4TH GENERATION: NONREACTIVE

## 2018-01-12 LAB — RPR: RPR Ser Ql: NONREACTIVE

## 2018-01-12 LAB — HEPATITIS B SURFACE ANTIGEN: HEP B S AG: NEGATIVE

## 2018-01-12 LAB — HEPATITIS C ANTIBODY: Hep C Virus Ab: <0.1 s/co ratio (ref 0.0–0.9)

## 2018-01-13 LAB — CYTOLOGY - PAP
CHLAMYDIA, DNA PROBE: NEGATIVE
Diagnosis: NEGATIVE
Neisseria Gonorrhea: NEGATIVE

## 2018-01-14 ENCOUNTER — Encounter: Payer: Self-pay | Admitting: Obstetrics and Gynecology

## 2018-02-01 ENCOUNTER — Ambulatory Visit: Payer: Medicaid Other | Admitting: Obstetrics and Gynecology

## 2018-03-04 ENCOUNTER — Encounter: Payer: Self-pay | Admitting: Obstetrics and Gynecology

## 2018-03-04 ENCOUNTER — Ambulatory Visit (INDEPENDENT_AMBULATORY_CARE_PROVIDER_SITE_OTHER): Payer: Medicaid Other | Admitting: Obstetrics and Gynecology

## 2018-03-04 VITALS — BP 115/64 | HR 77 | Ht 71.0 in | Wt 219.0 lb

## 2018-03-04 DIAGNOSIS — B9689 Other specified bacterial agents as the cause of diseases classified elsewhere: Secondary | ICD-10-CM

## 2018-03-04 DIAGNOSIS — N760 Acute vaginitis: Secondary | ICD-10-CM

## 2018-03-04 DIAGNOSIS — R635 Abnormal weight gain: Secondary | ICD-10-CM

## 2018-03-04 MED ORDER — METRONIDAZOLE 500 MG PO TABS: 500.0000 mg | ORAL_TABLET | Freq: Two times a day (BID) | ORAL | 1 refills | Status: AC

## 2018-03-04 NOTE — Progress Notes (Signed)
Patient ID: Alexis [name], female   DOB: [birth year], 27 y.o.   MRN: [id number]    [facility]  @DATE @            Patient name: Alexis [name] MRN [id number]  Date of birth: [birth year]  CC & HPI:  Pt with lethargic somewhat imprecise history, needed clarifications.  Alexis [name] is a 27 y.o. female presenting today for vaginal discharge and weight gain (20lbs since last visit on 01/11/2018). Her last PAP was on 01/11/2018 and was negative. She has a FHx of ovarian and breast cancer -- grandmother had breast cancer. She [facility] her pharmacy [facility] [place].  She was prescribed Metronidazole tablets x7 days at her last visit for the vaginal discharge and never picked up her prescription. She also has gained 20 lbs since her last visit on 01/11/2018 and it is making her feel depressed because she is not used to weighing this much. The weight gain began in March 2019. She reports eating healthy portion sizes. She sees Dr. Almon HerculesAnita Jackson for substance abuse but has not discussed the weight gain with her. She sees Dr. Elba Barmanhala once a month. She also used to take Suboxone that causes her hands feet, and throat to swell.   She reports nausea that does not usually last 24/7. She reports that one time she ate a fresh, fully cooked meal and felt nauseous 10min later. She took Zofran, which did not help. The next day, she did not eat a lot because she still felt sick. She reports having numerous episodes like this. She has never taken Reglan. She reports a lot of heroin use in the past. She took Rx Subutex until April. She has her menstrual period right now but it is the first time in a few months.  The patient denies fever, chills or any other symptoms or complaints at this time.    ROS:  ROS + weight gain + depression + nausea - fever - chills  Social history significant the patient [facility] [place]. She does not have  custody of her children -- 2 children are with their fathers and one child is with the patient's mother.  All systems are negative except as noted in the HPI and PMH.   Pertinent History Reviewed:   Reviewed: Depression Medical         Past Medical History:  Diagnosis Date  . Anxiety   . Depression   . Substance abuse [facility](HCC)                               Surgical Hx:    Past Surgical History:  Procedure Laterality Date  . NO PAST SURGERIES    . WISDOM TOOTH EXTRACTION     Medications: Reviewed & Updated - see associated section                       Current Outpatient Medications:  .  buprenorphine (SUBUTEX) 8 MG SUBL SL tablet, Place 8 mg under the tongue 2 (two) times daily., Disp: , Rfl:  .  Multiple Vitamin (MULTIVITAMIN) tablet, Take 1 tablet by mouth daily., Disp: , Rfl:  .  promethazine (PHENERGAN) 25 MG tablet, Take 1 tablet (25 mg total) by mouth every 6 (six) hours as needed for nausea or vomiting. (Patient not taking: Reported on 04/17/2016), Disp: 20  tablet, Rfl: 0   Social History: Reviewed -  reports that she has been smoking cigarettes.  She has a 5.00 pack-year smoking history. She has never used smokeless tobacco.  Objective Findings:  Vitals: Blood pressure 115/64, pulse 77, height 5\' 11"  (1.803 m), weight 219 lb (99.3 kg), last menstrual period 02/28/2018, unknown if currently breastfeeding.  PHYSICAL EXAMINATION General appearance - alert, well appearing, and in no distress, oriented to person, place, and time and overweight Mental status - alert, oriented to person, place, and time, normal mood, behavior, speech, dress, motor activity, and thought processes, affect appropriate to mood  PELVIC DEFERRED  Assessment & Plan:   A:  1. Vaginal Discharge Hx BV without treatment 2. Weight gain 3. 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  Rx Metronidazole pills x7 days  2. Rx Phenergan 3. Pt referred back to  Dr. Almon Hercules for medication and Dr. Coralie Keens prn 4. Track nausea in relation to when she eats/takes medication 5. F/U prn  By signing my name below, I, Pietro Cassis, attest that this documentation has been prepared under the direction and in the presence of Tilda Burrow, MD. Electronically Signed: Pietro Cassis, Medical Scribe. 03/04/18. 2:35 PM.  I personally performed the services described in this documentation, which was SCRIBED in my presence. The recorded information has been reviewed and considered accurate. It has been edited as necessary during review. Tilda Burrow, MD

## 2018-07-21 ENCOUNTER — Other Ambulatory Visit: Payer: Self-pay

## 2018-07-21 ENCOUNTER — Encounter (HOSPITAL_COMMUNITY): Payer: Self-pay | Admitting: Emergency Medicine

## 2018-07-21 ENCOUNTER — Emergency Department (HOSPITAL_COMMUNITY)
Admission: EM | Admit: 2018-07-21 | Discharge: 2018-07-21 | Disposition: A | Discharge status: Home / Self Care | Payer: Medicaid Other | Attending: Emergency Medicine | Admitting: Emergency Medicine

## 2018-07-21 DIAGNOSIS — F17210 Nicotine dependence, cigarettes, uncomplicated: Secondary | ICD-10-CM | POA: Diagnosis not present

## 2018-07-21 DIAGNOSIS — F1123 Opioid dependence with withdrawal: Secondary | ICD-10-CM | POA: Diagnosis not present

## 2018-07-21 DIAGNOSIS — F419 Anxiety disorder, unspecified: Secondary | ICD-10-CM | POA: Diagnosis present

## 2018-07-21 DIAGNOSIS — Z79899 Other long term (current) drug therapy: Secondary | ICD-10-CM | POA: Diagnosis not present

## 2018-07-21 DIAGNOSIS — F1193 Opioid use, unspecified with withdrawal: Secondary | ICD-10-CM

## 2018-07-21 HISTORY — DX: Insomnia, unspecified: G47.00

## 2018-07-21 HISTORY — DX: Other specified behavioral and emotional disorders with onset usually occurring in childhood and adolescence: F98.8

## 2018-07-21 MED ORDER — HYDROXYZINE HCL 25 MG PO TABS
25.0000 mg | ORAL_TABLET | Freq: Once | ORAL | Status: AC
  Administered 2018-07-21: 25 mg via ORAL
  Filled 2018-07-21: qty 1

## 2018-07-21 MED ORDER — DICYCLOMINE HCL 20 MG PO TABS
20.0000 mg | ORAL_TABLET | Freq: Once | ORAL | Status: AC
  Administered 2018-07-21: 20 mg via ORAL
  Filled 2018-07-21: qty 1

## 2018-07-21 MED ORDER — HYDROXYZINE HCL 25 MG PO TABS: 25.0000 mg | ORAL_TABLET | Freq: Four times a day (QID) | ORAL | 0 refills | Status: AC | PRN

## 2018-07-21 MED ORDER — ONDANSETRON 4 MG PO TBDP
4.0000 mg | ORAL_TABLET | Freq: Once | ORAL | Status: AC
  Administered 2018-07-21: 4 mg via ORAL
  Filled 2018-07-21: qty 1

## 2018-07-21 MED ORDER — CLONIDINE HCL 0.1 MG PO TABS
0.1000 mg | ORAL_TABLET | Freq: Once | ORAL | Status: AC
  Administered 2018-07-21: 0.1 mg via ORAL
  Filled 2018-07-21: qty 1

## 2018-07-21 MED ORDER — CLONIDINE HCL 0.1 MG PO TABS: ORAL_TABLET | ORAL | 0 refills | Status: DC

## 2018-07-21 MED ORDER — METHOCARBAMOL 500 MG PO TABS
500.0000 mg | ORAL_TABLET | Freq: Once | ORAL | Status: AC
  Administered 2018-07-21: 500 mg via ORAL
  Filled 2018-07-21: qty 1

## 2018-07-21 MED ORDER — DICYCLOMINE HCL 20 MG PO TABS: 20.0000 mg | ORAL_TABLET | Freq: Four times a day (QID) | ORAL | 0 refills | Status: DC | PRN

## 2018-07-21 MED ORDER — METHOCARBAMOL 500 MG PO TABS: 500.0000 mg | ORAL_TABLET | Freq: Three times a day (TID) | ORAL | 0 refills | Status: AC | PRN

## 2018-07-21 MED ORDER — ONDANSETRON 4 MG PO TBDP: 4.0000 mg | ORAL_TABLET | Freq: Three times a day (TID) | ORAL | 0 refills | Status: AC | PRN

## 2018-07-21 MED ORDER — NAPROXEN 250 MG PO TABS
500.0000 mg | ORAL_TABLET | Freq: Once | ORAL | Status: AC
  Administered 2018-07-21: 500 mg via ORAL
  Filled 2018-07-21: qty 2

## 2018-07-21 MED ORDER — NAPROXEN 500 MG PO TABS: 500.0000 mg | ORAL_TABLET | Freq: Two times a day (BID) | ORAL | 0 refills | Status: AC | PRN

## 2018-07-21 MED ORDER — LOPERAMIDE HCL 2 MG PO CAPS: 2.0000 mg | ORAL_CAPSULE | ORAL | Status: DC | PRN

## 2018-07-21 MED ORDER — LOPERAMIDE HCL 2 MG PO CAPS: 2.0000 mg | ORAL_CAPSULE | Freq: Four times a day (QID) | ORAL | 0 refills | Status: DC | PRN

## 2018-07-21 NOTE — ED Provider Notes (Signed)
[facility] EMERGENCY [facility] Provider Note   CSN: [id number] Arrival date & time: 07/21/18  1703     History   Chief Complaint Chief Complaint  Patient presents with  . Withdrawal    HPI Lamont SnowballKristen D Giacomo is a 27 y.o. female.  HPI Pt was seen at 1850. Per pt, c/o gradual onset and persistence of constant "withdrawal symptoms" after running out of her subutex 1 week ago. Pt states she has been "taking some xanax" for her symptoms without improvement. Pt describes her withdrawal symptoms as anxiety, nervousness, nausea, abd cramping, generalized body aches. Denies fevers, no rash, no abd pain, no CP/SOB, no focal motor weakness.    Past Medical History:  Diagnosis Date  . ADD (attention deficit disorder)   . Anxiety   . Depression   . Insomnia   . Substance abuse [facility](HCC)     Patient Active Problem List   Diagnosis Date Noted  . Pregnancy complicated by subutex maintenance, antepartum (HCC) 04/18/2016  . Preterm premature rupture of membranes (PPROM) at 3834w1d with unknown onset of labor 04/17/2016    Past Surgical History:  Procedure Laterality Date  . NO PAST SURGERIES    . WISDOM TOOTH EXTRACTION       OB History    Gravida  3   Para  3   Term  1   Preterm  2   AB      Living  3     SAB      TAB      Ectopic      Multiple  0   Live Births  3            Home Medications    Prior to Admission medications   Medication Sig Start Date End Date Taking? Authorizing Provider  buprenorphine (SUBUTEX) 8 MG SUBL SL tablet Place 8 mg under the tongue 2 (two) times daily.    [provider]  Multiple Vitamin (MULTIVITAMIN) tablet Take 1 tablet by mouth daily.    [provider]  promethazine (PHENERGAN) 25 MG tablet Take 1 tablet (25 mg total) by mouth every 6 (six) hours as needed for nausea or vomiting. Patient not taking: Reported on 04/17/2016 12/11/15   Emily FilbertWilliams, Jonathan E, MD    Family History Family History  Problem  Relation Age of Onset  . Depression Paternal Grandmother   . Hypertension Maternal Grandmother   . Cancer Maternal Grandmother        skin  . Hypertension Mother     Social History Social History   Tobacco Use  . Smoking status: Current Some Day Smoker    Packs/day: 0.50    Years: 10.00    Pack years: 5.00    Types: Cigarettes  . Smokeless tobacco: Never Used  Substance Use Topics  . Alcohol use: No  . Drug use: Yes    Types: Heroin, Cocaine, Marijuana    Comment: valium, cocaine 3-4 days ago     Allergies   Naloxone; Tramadol; and Vicodin [hydrocodone-acetaminophen]   Review of Systems Review of Systems ROS: Statement: All systems negative except as marked or noted in the HPI; Constitutional: Negative for fever and chills. +generalized body aches.; ; Eyes: Negative for eye pain, redness and discharge. ; ; ENMT: Negative for ear pain, hoarseness, nasal congestion, sinus pressure and sore throat. ; ; Cardiovascular: Negative for chest pain, palpitations, diaphoresis, dyspnea and peripheral edema. ; ; Respiratory: Negative for cough, wheezing and stridor. ; ; Gastrointestinal: +nausea,  abd cramping. Negative for vomiting, diarrhea, abdominal pain, blood in stool, hematemesis, jaundice and rectal bleeding. . ; ; Genitourinary: Negative for dysuria, flank pain and hematuria. ; ; Musculoskeletal: Negative for back pain and neck pain. Negative for swelling and trauma.; ; Skin: Negative for pruritus, rash, abrasions, blisters, bruising and skin lesion.; ; Neuro: Negative for headache, lightheadedness and neck stiffness. Negative for weakness, altered level of consciousness, altered mental status, extremity weakness, paresthesias, involuntary movement, seizure and syncope.; Psych:  +anxiety. No SI, no SA, no HI, no hallucinations.        Physical Exam Updated Vital Signs BP 121/73   Pulse 79   Temp 98.1 F (36.7 C) (Oral)   Resp 18   Ht 5\' 11"  (1.803 m)   Wt 95.3 kg   LMP  07/18/2018   SpO2 98%   BMI 29.29 kg/m   Physical Exam 1855: Physical examination:  Nursing notes reviewed; Vital signs and O2 SAT reviewed;  Constitutional: Well developed, Well nourished, Well hydrated, In no acute distress; Head:  Normocephalic, atraumatic; Eyes: EOMI, PERRL, No scleral icterus; ENMT: Mouth and pharynx normal, Mucous membranes moist; Neck: Supple, Full range of motion, No lymphadenopathy; Cardiovascular: Regular rate and rhythm, No gallop; Respiratory: Breath sounds clear & equal bilaterally, No wheezes.  Speaking full sentences with ease, Normal respiratory effort/excursion; Chest: Nontender, Movement normal; Abdomen: Soft, Nontender, Nondistended, Normal bowel sounds; Genitourinary: No CVA tenderness; Extremities: Peripheral pulses normal, No tenderness, No edema, No calf edema or asymmetry.; Neuro: AA&Ox3, Major CN grossly intact.  Speech clear. No gross focal motor or sensory deficits in extremities.; Skin: Color normal, Warm, Dry.; Psych:  Anxious.    ED Treatments / Results  Labs (all labs ordered are listed, but only abnormal results are displayed)   EKG None  Radiology   Procedures Procedures (including critical care time)  Medications Ordered in ED Medications  dicyclomine (BENTYL) tablet 20 mg (has no administration in time range)  loperamide (IMODIUM) capsule 2-4 mg (has no administration in time range)  hydrOXYzine (ATARAX/VISTARIL) tablet 25 mg (25 mg Oral Given 07/21/18 1925)  methocarbamol (ROBAXIN) tablet 500 mg (500 mg Oral Given 07/21/18 1925)  naproxen (NAPROSYN) tablet 500 mg (500 mg Oral Given 07/21/18 1925)  ondansetron (ZOFRAN-ODT) disintegrating tablet 4 mg (4 mg Oral Given 07/21/18 1925)  cloNIDine (CATAPRES) tablet 0.1 mg (0.1 mg Oral Given 07/21/18 1925)     Initial Impression / Assessment and Plan / ED Course  I have reviewed the triage vital signs and the nursing notes.  Pertinent labs & imaging results that were available  during my care of the patient were reviewed by me and considered in my medical decision making (see chart for details).  MDM Reviewed: nursing note, previous chart and vitals    2025:  Pt has tol PO well while in the ED without N/V. No stooling while in the ED. Abd remains benign, resps easy, VSS.  Pt feels somewhat better and is ready to go home now. Tx symptomatically for withdrawal. No indication for TTS consult or medical admission at this time. Dx and testing d/w pt.  Questions answered.  Verb understanding, agreeable to d/c home with outpt f/u.     Final Clinical Impressions(s) / ED Diagnoses   Final diagnoses:  None    ED Discharge Orders    None       Samuel Jester, DO 07/26/18 1610

## 2018-07-21 NOTE — ED Notes (Signed)
AC notified of med need

## 2018-07-21 NOTE — ED Triage Notes (Signed)
Patient reports withdrawal symptoms and anxiety. States she has been off her Subutex for 1 week. Reports she has not had any of her other medications.

## 2018-07-21 NOTE — Discharge Instructions (Addendum)
Substance Abuse Treatment Programs ° °Intensive Outpatient Programs °[facility]     °[address]      °[place], [place]                   °[phone]      ° °[facility] °[address] °[place], [place] °[phone] ° °[facility]     °(Inpatient and outpatient)     °[address]           °[phone]   ° °[facility] °[phone] (Suboxone and Methadone) ° °[address]      °[place], [place] [address]      °[phone]      ° °[address] °[place], [place] °852-3033 ° °[facility] (Outpatient/Inpatient, Chemical)    °(insurance only) [phone]      °       °[facility] (Groups & Residential) °[place], [place] °[phone] ° °   °[facility]     °[address]     °[place], [place]      °[phone]      ° °[facility] (for caregivers and family) °[address] °[place], [place] °[phone] ° ° ° ° ° °Residential Treatment Programs °[facility]      °[address], [place], [place] [address]  °[phone]      ° °[facility] °[address] [place], [place] [address] °[phone] ° °[facility]        °[phone]      ° °[facility] °[phone] ° °[facility]             °[address]                                         °[place], [place]                                                °[phone] or [phone]                              ° °[facility] [place] °[address] °[place], [address] °[phone] ° °[facility]    °[address]      °, [place]     °[phone]      ° °[facility] °[address] °, [place] °[phone] ° °[facility]   °[address]     °[place], [place] [address]     °[phone]      °Admissions: 8am-3pm M-F ° °[facility] °[address] °[place],  [place] °[phone] ° °[facility]: Residential Program (90 Days)   °[place], [place]      °[phone] or [phone]    ° °[facility] °[place], [place] °(Walk in Hours over the weekend or by referral) ° °[facility] °[address], [place], [place] [address] °([phone] ° °Crisis Mobile: [facility]:  1-877-626-1772 (for crisis response 24 hours a day) °[facility] Hotline:      1-800-256-2452 °Outpatient Psychiatry and Counseling ° °[facility]: Mobile Crisis   Management 24 hours:  1-579-867-5796  [facility] of the [employer] sliding scale fee and walk in schedule: M-F 8am-12pm/1pm-3pm [address]  [place], [place] [address] [place], [place] [address] [phone]  [facility] ([facility])- new patient walk-in appointments available Monday - Friday 8am -3pm.          [address] [place], [place] [address] [phone] or crisis line- [facility]/ Intensive Outpatient Therapy Program [place], [place] [address] [place]      [phone] [place] [place], [place] [address]                 [place]   [facility] [phone] [place], [place] [address]   [employer] [facility]          [address] Johnette Abraham  [place], [place] [address]       [phone]  [facility] [address] [place], [place] [address] [phone]  [facility]    [address] [place], [place] [address]     [place], [place]elyn [place]     [place] [address]     [phone]       [facility] [place] [address]  [facility]     [address], [place], [place], [place] [address] [place]     [address]     [place], [place] [address]     [phone]       [facility] [address] [place], [place] [address] [phone] Resources for Temporary Residential Assistance/Crisis [facility]) M-F 8am-3pm   [address] [place], [place] [address]   [phone] Services include: laundry, barbering, support groups, case management, phone  & computer access, showers, AA/NA mtgs, mental health/substance abuse nurse, job skills class, disability information, VA assistance, spiritual classes, etc.   [facility]   [address], [place]     [place]              [employer] (women and children)       [place]. [place], [place] [address] [phone] [email or web address] for application and process Application Required  [facility]   [address]    [place] [address]     [phone]                    [place],  [address] [phone](OILNZVJK application appt.) Application Required  [facility] (women only)    [address]     [place],  [address]     [phone]  Intake starts 6pm daily Need valid ID, SSC, & Police report [facility] [place] [address] [place], [place] [phone] Application Required  [facility] (men only)     [address]      [place], [place]     [phone]       [facility] [place] (Pregnant women only) [address] [place], [place] [phone]  [facility]      [address]      [place], [place] [address]     [phone]             [facility] [address] [place], [place] [phone] 90 day commitment/SA/Application process  [facility](men only)     [address]     [place], [place]     [phone]       Check-in at [facility]            [facility] [address] [place], [place] [address] [phone] Men/Women/Women and Children must be there by 7 pm  [facility] [place], [place] [phone]                  Take the prescriptions as directed.  Call your regular medical doctor and the outpatient detox resources given  to you tomorrow to schedule a follow up appointment within the next week.  Return to the Emergency Department immediately sooner if worsening.

## 2018-08-26 ENCOUNTER — Ambulatory Visit (INDEPENDENT_AMBULATORY_CARE_PROVIDER_SITE_OTHER): Payer: Medicaid Other | Admitting: Obstetrics and Gynecology

## 2018-08-26 ENCOUNTER — Encounter: Payer: Self-pay | Admitting: Obstetrics and Gynecology

## 2018-08-26 VITALS — BP 121/72 | HR 115 | Ht 71.0 in | Wt 208.2 lb

## 2018-08-26 DIAGNOSIS — N816 Rectocele: Secondary | ICD-10-CM

## 2018-08-26 MED ORDER — METRONIDAZOLE 500 MG PO TABS: 500.0000 mg | ORAL_TABLET | Freq: Two times a day (BID) | ORAL | 1 refills | Status: DC

## 2018-08-26 NOTE — Progress Notes (Addendum)
[facility] Visit  08/26/2018            Patient name: Alexis [name] MRN [id number]  Date of birth: Jan 07, 1991  CC & HPI:  Alexis [name] is a 28 y.o. female presenting today for a "bulge" of tissue x 2 days ago at her vaginal entrance after she strained. She has had three children, all delivered vaginally. Pt reports associated intermittent lower abdominal pain and vaginal discharge. Pt hasn't tried any medications for the relief of her symptoms. She denies any issues with bowel movement, malodorous vaginal discharge, and any other symptoms.    ROS:  ROS +"bulging" tissue at vaginal entrance -painful bowel movements +vaginal discharge -malodorous vaginal discharge All systems are negative except as noted in the HPI and PMH.    Pertinent History Reviewed:   Reviewed: Significant for  Medical         Past Medical History:  Diagnosis Date  . ADD (attention deficit disorder)   . Anxiety   . Depression   . Insomnia   . Substance abuse [facility])                               Surgical Hx:    Past Surgical History:  Procedure Laterality Date  . NO PAST SURGERIES    . WISDOM TOOTH EXTRACTION     Medications: Reviewed & Updated - see associated section                       Current Outpatient Medications:  .  ALPRAZolam (XANAX) 0.5 MG tablet, Take 0.5 mg by mouth 2 (two) times daily as needed for anxiety., Disp: , Rfl:  .  buprenorphine (SUBUTEX) 8 MG SUBL SL tablet, Place 8 mg under the tongue 3 (three) times daily. , Disp: , Rfl:  .  gabapentin (NEURONTIN) 300 MG capsule, Take 300 mg by mouth 3 (three) times daily., Disp: , Rfl:  .  QUEtiapine (SEROQUEL) 300 MG tablet, Take 300 mg by mouth at bedtime., Disp: , Rfl:    Social History: Reviewed -  reports that she has been smoking cigarettes. She has a 5.00 pack-year smoking history. She has never used smokeless tobacco.  Objective Findings:  Vitals: Blood pressure 121/72, pulse (!) 115, height 5\' 11"  (1.803  m), weight 208 lb 3.2 oz (94.4 kg), not currently breastfeeding.  PHYSICAL EXAMINATION General appearance - alert, well appearing, and in no distress and oriented to person, place, and time Mental status - alert, oriented to person, place, and time, normal mood, behavior, speech, dress, motor activity, and thought processes  PELVIC Vagina - slight malodor noted.  Adnexa - unremarkable.  Wet [place] - positive whiff test, negative yeast, positive clue cells Rectal - no masses, mild rectocele s/p obstetric laceration     Assessment & Plan:   A:  1.  Mild rectocele 2. BV  P:  1.  Will prescribe Metronidazole 500 mg BID x 7 days    By signing my name below, I, Soijett Blue, attest that this documentation has been prepared under the direction and in the presence of Tilda Burrow, MD. Electronically Signed: Soijett Blue, Stage manager. 08/26/18. 3:38 PM.  I personally performed the services described in this documentation, which was SCRIBED in my presence. The recorded information has been reviewed and considered accurate. It has been edited as necessary during review. Tilda Burrow, MD

## 2018-08-26 NOTE — Patient Instructions (Signed)

## 2018-08-30 ENCOUNTER — Telehealth: Payer: Self-pay | Admitting: Obstetrics and Gynecology

## 2018-08-30 NOTE — Telephone Encounter (Signed)
Said Ferg told her to call if she decided she wanted surgery for hernia

## 2018-09-06 ENCOUNTER — Telehealth: Payer: Self-pay | Admitting: *Deleted

## 2018-09-06 NOTE — Telephone Encounter (Signed)
Spoke with pt. Dr. Emelda Fear states pt has a hernia and she feels like she's getting worse. Pt feels like her stomach is swollen and it hurts to have a BM. Pt having to strain with BM's. Advised needs to be rechecked by Dr. Emelda Fear. Pt voiced understanding. Call transferred to front desk for appt. JSY

## 2018-09-07 ENCOUNTER — Encounter: Payer: Self-pay | Admitting: Obstetrics and Gynecology

## 2018-09-07 ENCOUNTER — Ambulatory Visit (INDEPENDENT_AMBULATORY_CARE_PROVIDER_SITE_OTHER): Payer: Medicaid Other | Admitting: Obstetrics and Gynecology

## 2018-09-07 VITALS — BP 112/72 | HR 83 | Ht 71.0 in | Wt 209.2 lb

## 2018-09-07 DIAGNOSIS — K5903 Drug induced constipation: Secondary | ICD-10-CM | POA: Diagnosis not present

## 2018-09-07 DIAGNOSIS — T402X5A Adverse effect of other opioids, initial encounter: Secondary | ICD-10-CM

## 2018-09-07 MED ORDER — LINACLOTIDE 145 MCG PO CAPS: 145.0000 ug | ORAL_CAPSULE | Freq: Every day | ORAL | 2 refills | Status: DC
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
## 2018-09-29 ENCOUNTER — Encounter: Payer: Self-pay | Admitting: Allergy & Immunology

## 2018-09-29 ENCOUNTER — Ambulatory Visit (INDEPENDENT_AMBULATORY_CARE_PROVIDER_SITE_OTHER): Payer: Medicaid Other | Admitting: Allergy & Immunology

## 2018-09-29 VITALS — BP 102/62 | HR 94 | Temp 97.7°F | Resp 18 | Ht 70.0 in | Wt 201.0 lb

## 2018-09-29 DIAGNOSIS — F1920 Other psychoactive substance dependence, uncomplicated: Secondary | ICD-10-CM | POA: Diagnosis not present

## 2018-09-29 DIAGNOSIS — T50905D Adverse effect of unspecified drugs, medicaments and biological substances, subsequent encounter: Secondary | ICD-10-CM | POA: Diagnosis not present

## 2018-09-29 NOTE — Progress Notes (Addendum)
NEW PATIENT  Date of Service/Encounter:  09/30/18  Referring provider: [facility], [facility]   Assessment:   Adverse effect of drug (naloxone)  Drug dependence   [name] presents for an evaluation of possible drug allergy to Alexis [name].  She clearly can tolerate buprenorphine since this is present in the Subutex.  Therefore she is likely reacting to the naloxone component of the Suboxone.  Unfortunately, we do not have testing that is validated to any drug other than penicillin.  This is because each person's body breaks down drugs in different ways, so the multitude of byproducts is nearly impossible to test for.  The only way to definitively rule in or out most drug allergies is to give the drug and see how the patient responds.  Her reactions are consistent with an IgE mediated reaction, specifically with regards to the presence of hives and throat swelling.  The nausea could just be a side effect of heroin withdrawal, but can also be seen in IgE mediated reactions.  She has had these reactions on more than one occasion to the naloxone, so she could indeed have an allergy.  Unfortunately, the history in the ER note which is her most recent exposure to naloxone, mentions absolutely nothing about an exposure to the drug.  The patient tells a different story completely.  Therefore, the only way at this point to definitively rule in a naloxone allergy is to give her naloxone here in the clinic.  However, by history alone, her reaction certainly could be consistent with an IgE mediated reaction to naloxone.  Plan/Recommendations:   1. Adverse effect of drug (suboxone) - I will write a detailed letter explaining your reaction to the suboxone. - I will send this to the [facility].  - There is no validated testing to any medication at all, so testing to the medication would not help our management at all. - The only definitive way to prove the allergy is to give you the  medication while we watch you, so hopefully we do not have to do that.  - We will call you to give you updates.  2. Return if symptoms worsen or fail to improve.  Subjective:   Alexis [name] is a 28 y.o. female presenting today for evaluation of  Chief Complaint  Patient presents with  . Medication Allergy    concerned about an allergy to suboxone and naloxone.     Alexis TOOTHAKER has a history of the following: Patient Active Problem List   Diagnosis Date Noted  . Drug dependence 09/30/2018  . Therapeutic opioid-induced constipation (OIC) 09/07/2018  . Pregnancy complicated by subutex maintenance, antepartum (HCC) 04/18/2016  . Preterm premature rupture of membranes (PPROM) at [redacted]w[redacted]d with unknown onset of labor 04/17/2016    History obtained from: chart review and patient.  Alexis [name] was referred by Health, [facility].     Alexis [name] is a 28 y.o. female presenting for an evaluation of concern for drug allergies.  Specifically, she is concerned with an allergy to Suboxone (buprenorphine and naloxone). 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  She has been doing this since she was a teenager, but she has been in treatment programs on and off for a number of years.  Currently she is in a treatment program at [facility] in [place].   [facility] [place], [place] [address] [place], [place] [address]  She has been on Subutex (buprenorphine) for three years.  She was placed on Subutex when she was pregnant with one of her children and she has remained on it since that time.  She has been paying out of pocket for this since Medicaid only pays for Suboxone, as of January 2020, according to the patient.  She did have a PA for Subutex since she had a history of an allergy to Suboxone; clearly this PA lapsed.    Her label as Suboxone allergic dates back to 4  to 5 years ago or more.  At that time, she and her friend were wanting some heroin and since she did not have access to it they took Suboxone instead. She developed hives and vomiting and throat swelling.  Because of this, she was placed on Subutex and 2016 or 2017 when she entered a drug treatment program.  Her second reaction to Suboxone was in April 2019.  At that time, she had taken Suboxone once again and presented to the emergency room with swelling of her throat, hives, and emesis.  This is when Suboxone was added officially to her allergy list.  It is not clear why she took Suboxone at the time.  Review of the ED note makes absolutely no reference to Suboxone at all, but instead highlights her admission that she smokes marijuana laced with cocaine.  However, her allergy list shows that the naloxone was added as an allergy during that ER visit.  Otherwise, there is no history of other atopic diseases, including asthma, food allergies, environmental allergies, stinging insect allergies, eczema, urticaria or contact dermatitis. There is no significant infectious history. Vaccinations are up to date.    Past Medical History: Patient Active Problem List   Diagnosis Date Noted  . Drug dependence 09/30/2018  . Therapeutic opioid-induced constipation (OIC) 09/07/2018  . Pregnancy complicated by subutex maintenance, antepartum (HCC) 04/18/2016  . Preterm premature rupture of membranes (PPROM) at [redacted]w[redacted]d with unknown onset of labor 04/17/2016    Medication List:  Allergies as of 09/29/2018      Reactions   Naloxone Hives, Swelling   Tongue swelling, hives, throat swelling, angioedema of limbs   Tramadol Hives, Other (See Comments)   "seizure like activity."   Vicodin [hydrocodone-acetaminophen] Nausea And Vomiting      Medication List       Accurate as of September 29, 2018 11:59 PM. Always use your most recent med list.        ALPRAZolam 0.5 MG tablet Commonly known as:  XANAX Take 0.5  mg by mouth 2 (two) times daily as needed for anxiety.   buprenorphine 8 MG Subl SL tablet Commonly known as:  SUBUTEX Place 8 mg under the tongue 3 (three) times daily.   gabapentin 300 MG capsule Commonly known as:  NEURONTIN Take 300 mg by mouth 3 (three) times daily.   linaclotide 145 MCG Caps capsule Commonly known as:  LINZESS Take 1 capsule (145 mcg total) by mouth daily before breakfast. For chronic constipation   metroNIDAZOLE 500 MG tablet Commonly known as:  FLAGYL Take 1 tablet (500 mg total) by mouth 2 (two) times daily.   SEROQUEL 300 MG tablet Generic drug:  QUEtiapine Take 300 mg by mouth at bedtime.       Birth History: non-contributory  Developmental History: non-contributory  Past Surgical History: Past Surgical History:  Procedure Laterality Date  . WISDOM TOOTH EXTRACTION  Family History: Family History  Problem Relation Age of Onset  . Depression Paternal Grandmother   . Hypertension Maternal Grandmother   . Cancer Maternal Grandmother        skin  . Hypertension Mother   . Eczema Daughter   . Allergic rhinitis Neg Hx   . Angioedema Neg Hx   . Asthma Neg Hx   . Atopy Neg Hx   . Immunodeficiency Neg Hx   . Urticaria Neg Hx      Social History: Vennela lives at home in a [place] [address].  She lives in an apartment with hardwood in the main living areas as well as the bedroom.  She has gas heating and central cooling.  There is a cat outside of the home, but no animals inside the home.  She has dust mite covers on the bedding.  There is tobacco exposure throughout the home in the car.  She is currently in rehab and living at a halfway house.  She has 3 children, but unfortunately does not have custody of any of them at this point.   Review of Systems  Constitutional: Negative.  Negative for fever, malaise/fatigue and weight loss.  HENT: Negative.  Negative for congestion, ear discharge and ear pain.   Eyes: Negative for pain,  discharge and redness.  Respiratory: Negative for cough, sputum production, shortness of breath and wheezing.   Cardiovascular: Negative.  Negative for chest pain and palpitations.  Gastrointestinal: Negative for abdominal pain and heartburn.  Skin: Negative.  Negative for itching and rash.  Neurological: Negative for dizziness and headaches.  Endo/Heme/Allergies: Negative for environmental allergies. Does not bruise/bleed easily.       Objective:   Blood pressure 102/62, pulse 94, temperature 97.7 F (36.5 C), temperature source Oral, resp. rate 18, height  (1.778 m), weight 201 lb (91.2 kg), SpO2 98 %. Body mass index is 28.84 kg/m.   Physical Exam:   Physical Exam  Constitutional: She appears well-developed.  HENT:  Head: Normocephalic and atraumatic.  Right Ear: Tympanic membrane, external ear and ear canal normal. No drainage, swelling or tenderness. Tympanic membrane is not injected, not scarred, not erythematous, not retracted and not bulging.  Left Ear: Tympanic membrane, external ear and ear canal normal. No drainage, swelling or tenderness. Tympanic membrane is not injected, not scarred, not erythematous, not retracted and not bulging.  Nose: No mucosal edema, rhinorrhea, nasal deformity or septal deviation. No epistaxis. Right sinus exhibits no maxillary sinus tenderness and no frontal sinus tenderness. Left sinus exhibits no maxillary sinus tenderness and no frontal sinus tenderness.  Mouth/Throat: Uvula is midline and oropharynx is clear and moist. Mucous membranes are not pale and not dry.  Eyes: Pupils are equal, round, and reactive to light. Conjunctivae and EOM are normal. Right eye exhibits no chemosis and no discharge. Left eye exhibits no chemosis and no discharge. Right conjunctiva is not injected. Left conjunctiva is not injected.  Cardiovascular: Normal rate, regular rhythm and normal heart sounds.  Respiratory: Effort normal and breath sounds normal. No  accessory muscle usage. No tachypnea. No respiratory distress. She has no wheezes. She has no rhonchi. She has no rales. She exhibits no tenderness.  GI: There is no abdominal tenderness. There is no rebound and no guarding.  Lymphadenopathy:       Head (right side): No submandibular, no tonsillar and no occipital adenopathy present.       Head (left side): No submandibular, no tonsillar and no occipital adenopathy present.  She has no cervical adenopathy.  Neurological: She is alert.  Skin: No abrasion, no petechiae and no rash noted. Rash is not papular, not vesicular and not urticarial. No erythema. No pallor.  Psychiatric: She has a normal mood and affect.     Diagnostic studies: none        Malachi Bonds, MD [facility]

## 2018-09-29 NOTE — Patient Instructions (Addendum)
1. Adverse effect of drug (suboxone) - I will write a detailed letter explaining your reaction to the suboxone. - I will send this to the [facility].  - There is no validated testing to any medication at all, so testing to the medication would not help our management at all. - The only definitive way to prove the allergy is to give you the medication while we watch you, so hopefully we do not have to do that.  - We will call you to give you updates.  2. Return if symptoms worsen or fail to improve.   Please inform us of any Emergency Department visits, hospitalizations, or changes in symptoms. Call us before going to the ED for breathing or allergy symptoms since we might be able to fit you in for a sick visit. Feel free to contact us anytime with any questions, problems, or concerns.  It was a pleasure to meet you today!  Websites that have reliable patient information: 1. American Academy of Asthma, Allergy, and Immunology: www.aaaai.org 2. Food Allergy Research and Education (FARE): foodallergy.org 3. Mothers of Asthmatics: http://www.asthmacommunitynetwork.org 4. American College of Allergy, Asthma, and Immunology: MissingWeapons.ca   Make sure you are registered to vote! If you have moved or changed any of your contact information, you will need to get this updated before voting!    Voter ID laws are NOT going into effect for the General Election in November 2020! DO NOT let this stop you from exercising your right to vote!

## 2018-09-30 ENCOUNTER — Encounter: Payer: Self-pay | Admitting: Allergy & Immunology

## 2018-09-30 DIAGNOSIS — F1920 Other psychoactive substance dependence, uncomplicated: Secondary | ICD-10-CM | POA: Insufficient documentation

## 2018-10-06 ENCOUNTER — Ambulatory Visit: Payer: Self-pay | Admitting: Obstetrics and Gynecology

## 2019-01-13 ENCOUNTER — Other Ambulatory Visit: Payer: Self-pay | Admitting: Obstetrics and Gynecology

## 2019-01-23 ENCOUNTER — Encounter (HOSPITAL_COMMUNITY): Payer: Self-pay | Admitting: Emergency Medicine

## 2019-01-23 ENCOUNTER — Emergency Department (HOSPITAL_COMMUNITY)
Admission: EM | Admit: 2019-01-23 | Discharge: 2019-01-24 | Disposition: A | Discharge status: Home / Self Care | Payer: Medicaid Other | Attending: Emergency Medicine | Admitting: Emergency Medicine

## 2019-01-23 ENCOUNTER — Other Ambulatory Visit: Payer: Self-pay

## 2019-01-23 DIAGNOSIS — R1030 Lower abdominal pain, unspecified: Secondary | ICD-10-CM

## 2019-01-23 DIAGNOSIS — Z79899 Other long term (current) drug therapy: Secondary | ICD-10-CM | POA: Insufficient documentation

## 2019-01-23 DIAGNOSIS — R109 Unspecified abdominal pain: Secondary | ICD-10-CM | POA: Diagnosis present

## 2019-01-23 DIAGNOSIS — F17210 Nicotine dependence, cigarettes, uncomplicated: Secondary | ICD-10-CM | POA: Insufficient documentation

## 2019-01-23 HISTORY — DX: Uterovaginal prolapse, unspecified: N81.4

## 2019-01-23 LAB — BASIC METABOLIC PANEL
Anion gap: 8 (ref 5–15)
BUN: 10 mg/dL (ref 6–20)
CO2: 24 mmol/L (ref 22–32)
Calcium: 8.6 mg/dL — ABNORMAL LOW (ref 8.9–10.3)
Chloride: 104 mmol/L (ref 98–111)
Creatinine, Ser: 0.86 mg/dL (ref 0.44–1.00)
GFR calc Af Amer: >60 mL/min (ref >60)
GFR calc non Af Amer: >60 mL/min (ref >60)
Glucose, Bld: 93 mg/dL (ref 70–99)
Potassium: 3.4 mmol/L — ABNORMAL LOW (ref 3.5–5.1)
Sodium: 136 mmol/L (ref 135–145)

## 2019-01-23 LAB — CBC WITH DIFFERENTIAL/PLATELET
Abs Immature Granulocytes: 0.01 10*3/uL (ref 0.00–0.07)
Basophils Absolute: 0 10*3/uL (ref 0.0–0.1)
Basophils Relative: 1 %
Eosinophils Absolute: 0.1 10*3/uL (ref 0.0–0.5)
Eosinophils Relative: 3 %
HCT: 38.1 % (ref 36.0–46.0)
Hemoglobin: 12.4 g/dL (ref 12.0–15.0)
Immature Granulocytes: 0 %
Lymphocytes Relative: 25 %
Lymphs Abs: 1.2 10*3/uL (ref 0.7–4.0)
MCH: 30.5 pg (ref 26.0–34.0)
MCHC: 32.5 g/dL (ref 30.0–36.0)
MCV: 93.6 fL (ref 80.0–100.0)
Monocytes Absolute: 0.3 10*3/uL (ref 0.1–1.0)
Monocytes Relative: 7 %
Neutro Abs: 3 10*3/uL (ref 1.7–7.7)
Neutrophils Relative %: 64 %
Platelets: 221 10*3/uL (ref 150–400)
RBC: 4.07 MIL/uL (ref 3.87–5.11)
RDW: 13.5 % (ref 11.5–15.5)
WBC: 4.7 10*3/uL (ref 4.0–10.5)
nRBC: 0 % (ref 0.0–0.2)

## 2019-01-23 MED ORDER — KETOROLAC TROMETHAMINE 30 MG/ML IJ SOLN
30.0000 mg | Freq: Once | INTRAMUSCULAR | Status: AC
  Administered 2019-01-23: 30 mg via INTRAMUSCULAR
  Filled 2019-01-23: qty 1

## 2019-01-23 MED ORDER — OXYCODONE-ACETAMINOPHEN 5-325 MG PO TABS: 1.0000 | ORAL_TABLET | Freq: Once | ORAL | Status: DC

## 2019-01-23 NOTE — ED Triage Notes (Signed)
Pt states that she has a hernia between rectal wall and her vaginal wall. States that it has prolapsed out of her vagina.

## 2019-01-23 NOTE — ED Provider Notes (Signed)
[facility] Provider Note   CSN: [id number] Arrival date & time: 01/23/19  2204    History   Chief Complaint Chief Complaint  Patient presents with  . Hernia    HPI Alexis [name] is a 28 y.o. female.     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  Patient reports that she has noted increasing bulging from her vagina.  She thinks that this is the rectocele she was recently diagnosed with.  Additionally she reports abdominal pain.  It is achy and sharp at times.  It is mostly in the lower abdomen without radiation.  Currently she rates her pain at 10 out of 10.  She states she has had ongoing issues with pain but today it got worse while she was at work.  She denies any nausea or vomiting.  She had a bowel movement earlier today which was her normal consistency which tends to be hard.  She denies any bloody stools.  She has a Nexplanon and does not think she could be pregnant.  She denies any concerns for STDs.  No vaginal discharge or vaginal bleeding.  Past Medical History:  Diagnosis Date  . ADD (attention deficit disorder)   . Angio-edema   . Anxiety   . Cystocele with uterine descensus    uterine descensus per OB/GYN history- no mention of cystocele, only uterine descensus  . Depression   . Insomnia   . Substance abuse (HCC)   . Urticaria     Patient Active Problem List   Diagnosis Date Noted  . Drug dependence 09/30/2018  . Therapeutic opioid-induced constipation (OIC) 09/07/2018  . Pregnancy complicated by subutex maintenance, antepartum (HCC) 04/18/2016  . Preterm premature rupture of membranes (PPROM) at 3063w1d with unknown onset of labor 04/17/2016    Past Surgical History:  Procedure Laterality Date  . WISDOM TOOTH EXTRACTION       OB History    Gravida  3   Para  3   Term  1   Preterm  2   AB      Living  3      SAB      TAB      Ectopic      Multiple  0   Live Births  3            Home Medications    Prior to Admission medications   Medication Sig Start Date End Date Taking? Authorizing Provider  ALPRAZolam Prudy Feeler(XANAX) 0.5 MG tablet Take 0.5 mg by mouth 2 (two) times daily as needed for anxiety.    [provider]  buprenorphine (SUBUTEX) 8 MG SUBL SL tablet Place 8 mg under the tongue 3 (three) times daily.     [provider]  gabapentin (NEURONTIN) 300 MG capsule Take 300 mg by mouth 3 (three) times daily.    [provider]  linaclotide Karlene Einstein(LINZESS) 145 MCG CAPS capsule Take 1 capsule (145 mcg total) by mouth daily before breakfast. For chronic constipation 09/07/18   Tilda BurrowFerguson, John V, MD  metroNIDAZOLE (FLAGYL) 500 MG tablet Take 1 tablet (500 mg total) by mouth 2 (two) times daily. 08/26/18   Tilda BurrowFerguson, John V, MD  naproxen (NAPROSYN) 500 MG tablet Take 1 tablet (500 mg total) by mouth 2 (two) times daily. 01/24/19   , Mayer Maskerourtney F, MD  QUEtiapine (SEROQUEL) 300 MG tablet Take  300 mg by mouth at bedtime.    [provider]    Family History Family History  Problem Relation Age of Onset  . Depression Paternal Grandmother   . Hypertension Maternal Grandmother   . Cancer Maternal Grandmother        skin  . Hypertension Mother   . Eczema Daughter   . Allergic rhinitis Neg Hx   . Angioedema Neg Hx   . Asthma Neg Hx   . Atopy Neg Hx   . Immunodeficiency Neg Hx   . Urticaria Neg Hx     Social History Social History   Tobacco Use  . Smoking status: Current Some Day Smoker    Packs/day: 0.50    Years: 10.00    Pack years: 5.00    Types: Cigarettes  . Smokeless tobacco: Never Used  . Tobacco comment: 1 whole pack will last 1-2 weeks most of the time.   Substance Use Topics  . Alcohol use: Yes    Comment: very rare glass of wine.   . Drug use: Yes    Types: Heroin, Cocaine    Comment: today      Allergies   Naloxone, Tramadol,  and Vicodin [hydrocodone-acetaminophen]   Review of Systems Review of Systems  Constitutional: Negative for fever.  Respiratory: Negative for shortness of breath.   Cardiovascular: Negative for chest pain.  Gastrointestinal: Positive for abdominal pain and constipation. Negative for blood in stool, diarrhea, nausea and vomiting.  Genitourinary: Negative for dysuria and hematuria.  Musculoskeletal: Negative for back pain.  Neurological: Negative for weakness.  All other systems reviewed and are negative.    Physical Exam Updated Vital Signs BP 136/74   Pulse 92   Temp 98 F (36.7 C)   Resp 18   Ht 1.803 m (5\' 11" )   Wt 95.3 kg   LMP 01/09/2019   SpO2 99%   BMI 29.29 kg/m   Physical Exam Vitals signs and nursing note reviewed.  Constitutional:      Appearance: She is well-developed.     Comments: Anxious appearing  HENT:     Head: Normocephalic and atraumatic.  Eyes:     Pupils: Pupils are equal, round, and reactive to light.  Neck:     Musculoskeletal: Neck supple.  Cardiovascular:     Rate and Rhythm: Normal rate and regular rhythm.     Heart sounds: Normal heart sounds.  Pulmonary:     Effort: Pulmonary effort is normal. No respiratory distress.     Breath sounds: No wheezing.  Abdominal:     General: Bowel sounds are normal.     Palpations: Abdomen is soft.     Tenderness: There is no abdominal tenderness. There is no guarding or rebound.     Hernia: No hernia is present.  Genitourinary:    Comments: No bulging noted, diffuse tenderness palpation of bilateral adnexa and fundus, scant vaginal discharge Skin:    General: Skin is warm and dry.  Neurological:     Mental Status: She is alert and oriented to person, place, and time.  Psychiatric:        Mood and Affect: Mood normal.      ED Treatments / Results  Labs (all labs ordered are listed, but only abnormal results are displayed) Labs Reviewed  WET PREP, GENITAL - Abnormal; Notable for the  following components:      Result Value   WBC, Wet Prep HPF POC FEW (*)    All other components within  normal limits  URINALYSIS, ROUTINE W REFLEX MICROSCOPIC - Abnormal; Notable for the following components:   APPearance CLOUDY (*)    Ketones, ur 5 (*)    Leukocytes,Ua MODERATE (*)    Bacteria, UA RARE (*)    All other components within normal limits  BASIC METABOLIC PANEL - Abnormal; Notable for the following components:   Potassium 3.4 (*)    Calcium 8.6 (*)    All other components within normal limits  PREGNANCY, URINE  CBC WITH DIFFERENTIAL/PLATELET  HEPATIC FUNCTION PANEL  LIPASE, BLOOD  GC/CHLAMYDIA PROBE AMP ([place]) NOT AT [facility]    EKG None  Radiology Ct Abdomen Pelvis W Contrast  Result Date: 01/24/2019 CLINICAL DATA:  Sharp abdominal pain for 1-2 months. EXAM: CT ABDOMEN AND PELVIS WITH CONTRAST TECHNIQUE: Multidetector CT imaging of the abdomen and pelvis was performed using the standard protocol following bolus administration of intravenous contrast. CONTRAST:  198mL OMNIPAQUE IOHEXOL 300 MG/ML  SOLN COMPARISON:  05/08/2013 CT abdomen pelvis FINDINGS: LOWER CHEST: There is no basilar pleural or apical pericardial effusion. HEPATOBILIARY: The hepatic contours and density are normal. There is no intra- or extrahepatic biliary dilatation. The gallbladder is normal. PANCREAS: The pancreatic parenchymal contours are normal and there is no ductal dilatation. There is no peripancreatic fluid collection. SPLEEN: Normal. ADRENALS/URINARY TRACT: --Adrenal glands: Normal. --Right kidney/ureter: No hydronephrosis, nephroureterolithiasis, perinephric stranding or solid renal mass. --Left kidney/ureter: No hydronephrosis, nephroureterolithiasis, perinephric stranding or solid renal mass. --Urinary bladder: Normal for degree of distention STOMACH/BOWEL: --Stomach/Duodenum: There is no hiatal hernia or other gastric abnormality. The duodenal course and caliber are normal. --Small bowel:  No dilatation or inflammation. --Colon: No focal abnormality. --Appendix: Normal. VASCULAR/LYMPHATIC: Normal course and caliber of the major abdominal vessels. No abdominal or pelvic lymphadenopathy. REPRODUCTIVE: Normal uterus and ovaries. MUSCULOSKELETAL. No bony spinal canal stenosis or focal osseous abnormality. OTHER: None. IMPRESSION: No acute abnormality of the abdomen or pelvis. Electronically Signed   By: Ulyses Jarred M.D.   On: 01/24/2019 01:48    Procedures Procedures (including critical care time)  Medications Ordered in ED Medications  ketorolac (TORADOL) 30 MG/ML injection 30 mg (30 mg Intramuscular Given 01/23/19 2324)  iohexol (OMNIPAQUE) 300 MG/ML solution 100 mL (100 mLs Intravenous Contrast Given 01/24/19 0124)     Initial Impression / Assessment and Plan / ED Course  I have reviewed the triage vital signs and the nursing notes.  Pertinent labs & imaging results that were available during my care of the patient were reviewed by me and considered in my medical decision making (see chart for details).        Patient presents with abdominal pain.  Reports bulging in her vagina as well.  She is overall nontoxic-appearing and vital signs are reassuring.  Abdominal exam is fairly benign.  No evidence of significant rectocele or cystocele on exam.  Patient was given Toradol.  She is on chronic Subutex.  She reported persistent pain.  CT scan of the abdomen was obtained and shows no acute intra-abdominal pathology.  On recheck, she is resting comfortably.  Will discharge home with naproxen and follow-up with her OB/GYN.  After history, exam, and medical workup I feel the patient has been appropriately medically screened and is safe for discharge home. Pertinent diagnoses were discussed with the patient. Patient was given return precautions.   Final Clinical Impressions(s) / ED Diagnoses   Final diagnoses:  Lower abdominal pain    ED Discharge Orders  Ordered     naproxen (NAPROSYN) 500 MG tablet  2 times daily     01/24/19 0243           , Mayer Maskerourtney F, MD 01/24/19 [phone]

## 2019-01-24 ENCOUNTER — Emergency Department (HOSPITAL_COMMUNITY): Payer: Medicaid Other

## 2019-01-24 LAB — WET PREP, GENITAL
Clue Cells Wet Prep HPF POC: NONE SEEN
Sperm: NONE SEEN
Trich, Wet Prep: NONE SEEN
Yeast Wet Prep HPF POC: NONE SEEN

## 2019-01-24 LAB — HEPATIC FUNCTION PANEL
ALT: 12 U/L (ref 0–44)
AST: 19 U/L (ref 15–41)
Albumin: 4.3 g/dL (ref 3.5–5.0)
Alkaline Phosphatase: 65 U/L (ref 38–126)
Bilirubin, Direct: 0.1 mg/dL (ref 0.0–0.2)
Indirect Bilirubin: 0.8 mg/dL (ref 0.3–0.9)
Total Bilirubin: 0.9 mg/dL (ref 0.3–1.2)
Total Protein: 7.3 g/dL (ref 6.5–8.1)

## 2019-01-24 LAB — URINALYSIS, ROUTINE W REFLEX MICROSCOPIC
Bilirubin Urine: NEGATIVE
Glucose, UA: NEGATIVE mg/dL
Hgb urine dipstick: NEGATIVE
Ketones, ur: 5 mg/dL — AB
Nitrite: NEGATIVE
Protein, ur: NEGATIVE mg/dL
Specific Gravity, Urine: 1.025 (ref 1.005–1.030)
pH: 7 (ref 5.0–8.0)

## 2019-01-24 LAB — LIPASE, BLOOD: Lipase: 19 U/L (ref 11–51)

## 2019-01-24 LAB — PREGNANCY, URINE: Preg Test, Ur: NEGATIVE

## 2019-01-24 MED ORDER — IOHEXOL 300 MG/ML  SOLN
100.0000 mL | Freq: Once | INTRAMUSCULAR | Status: AC | PRN
  Administered 2019-01-24: 100 mL via INTRAVENOUS

## 2019-01-24 MED ORDER — NAPROXEN 500 MG PO TABS: 500.0000 mg | ORAL_TABLET | Freq: Two times a day (BID) | ORAL | 0 refills | Status: DC

## 2019-01-24 NOTE — ED Notes (Signed)
Pt given meal and drink ? ?

## 2019-01-24 NOTE — Discharge Instructions (Addendum)
Seen today for abdominal pain.  Your work-up is reassuring.  Your CT scan is negative.  Follow-up with your OB/GYN.

## 2019-01-25 LAB — GC/CHLAMYDIA PROBE AMP (~~LOC~~) NOT AT ARMC
Chlamydia: POSITIVE — AB
Neisseria Gonorrhea: POSITIVE — AB

## 2019-01-26 ENCOUNTER — Telehealth: Payer: Self-pay | Admitting: Adult Health

## 2019-01-26 ENCOUNTER — Telehealth: Payer: Self-pay | Admitting: *Deleted

## 2019-01-26 NOTE — Telephone Encounter (Signed)
Spoke with pt. Pt tested + for GC and CHL. Went to [facility] ER. Pt needs treatment. Pt don't want [place] to treat partner. Please advise. Thanks!! Alexis [name]

## 2019-01-26 NOTE — Telephone Encounter (Signed)
Called patient and advised of plan patient verbalized understanding.

## 2019-01-26 NOTE — Telephone Encounter (Signed)
Patient called, stated that she went to [facility], they just called her and stated she has STD and to follow up here.  Please advise.  [phone]

## 2019-01-26 NOTE — Telephone Encounter (Signed)
Thanks, Dee!!   Hildagarde Holleran, MD [facility]  

## 2019-01-26 NOTE — Telephone Encounter (Signed)
Notes have been faxed to their office with attn Lanny Hurst

## 2019-01-26 NOTE — Telephone Encounter (Signed)
Talked with Everlene Farrier about the call. I will write a letter to [place] can fax it.   Salvatore Marvel, MD [facility] [place] [facility] [place]

## 2019-01-26 NOTE — Telephone Encounter (Signed)
Called pharmacy Alexis [name] is not available left message to advising we would not be able to do in office challenge as symptoms in previous reactions are severe.Dr Ernst Bowler will fax over letter so they can approve another medication

## 2019-01-26 NOTE — Telephone Encounter (Signed)
Vanessa Barbara from [place] called. Patient in pharmacy requesting subutex/subuxone as she is allergic to nalaxone. States patient is requesting rx and it wont get approved by medicaid only if she is pregnant or she has documented allergy to [facility]. Advised we saw her but there is no way to test for a medication allergy we could only do an in-office challenge. Lanny Hurst states that would be great if we had those results then he could work on getting medication approved. Advised she did not complete this in office and did not come back for any follow up. States he will let patient know

## 2019-01-27 ENCOUNTER — Telehealth: Payer: Self-pay | Admitting: Adult Health

## 2019-01-27 ENCOUNTER — Encounter: Payer: Self-pay | Admitting: Adult Health

## 2019-01-27 DIAGNOSIS — A549 Gonococcal infection, unspecified: Secondary | ICD-10-CM

## 2019-01-27 DIAGNOSIS — A749 Chlamydial infection, unspecified: Secondary | ICD-10-CM

## 2019-01-27 HISTORY — DX: Gonococcal infection, unspecified: A54.9

## 2019-01-27 HISTORY — DX: Chlamydial infection, unspecified: A74.9

## 2019-01-27 MED ORDER — AZITHROMYCIN 500 MG PO TABS: ORAL_TABLET | ORAL | 0 refills | Status: DC

## 2019-01-27 NOTE — Telephone Encounter (Signed)
rx for azithromycin 500 mg #2 2 po now to [facility] Has appt 6/26 for rocephin at 9:45

## 2019-01-27 NOTE — Addendum Note (Signed)
Addended by: Derrek Monaco A on: 01/27/2019 02:17 PM   Modules accepted: Orders

## 2019-01-27 NOTE — Telephone Encounter (Signed)
VM full, sent SMS with #, if she calls needs appt to get rocphen and will give Rx for azithromycin too and needs POC 4 weeks after treatment, no sex. Called home number and lady said she went to homeless shelter last night.

## 2019-01-27 NOTE — Telephone Encounter (Signed)
Patient called back, she's scheduled for tomorrow.  She would like the antibiotic called to [facility].

## 2019-01-28 ENCOUNTER — Ambulatory Visit (INDEPENDENT_AMBULATORY_CARE_PROVIDER_SITE_OTHER): Payer: Medicaid Other | Admitting: *Deleted

## 2019-01-28 ENCOUNTER — Other Ambulatory Visit: Payer: Self-pay

## 2019-01-28 DIAGNOSIS — A549 Gonococcal infection, unspecified: Secondary | ICD-10-CM

## 2019-01-28 MED ORDER — CEFTRIAXONE SODIUM 250 MG IJ SOLR
250.0000 mg | Freq: Once | INTRAMUSCULAR | Status: AC
  Administered 2019-01-28: 10:00:00 250 mg via INTRAMUSCULAR

## 2019-01-28 NOTE — Progress Notes (Signed)
Rocephin 250mg  IM given in right gluteal with no complications. Pt to return in 4 weeks for POC.

## 2019-02-11 ENCOUNTER — Telehealth: Payer: Self-pay | Admitting: *Deleted

## 2019-02-11 NOTE — Telephone Encounter (Signed)
Pt has a hernia and feels like she needs to have surgery. Has seen Dr. Glo Herring for this before. Call transferred to front desk for appt. [place]

## 2019-02-11 NOTE — Telephone Encounter (Signed)
Patient called wanting to talk with someone about her hernia. Patient states she has a hernia behind her belly button and her "area: Please advise [phone]

## 2019-02-22 ENCOUNTER — Encounter: Payer: Self-pay | Admitting: Obstetrics and Gynecology

## 2019-02-22 ENCOUNTER — Other Ambulatory Visit: Payer: Self-pay

## 2019-02-22 ENCOUNTER — Ambulatory Visit (INDEPENDENT_AMBULATORY_CARE_PROVIDER_SITE_OTHER): Payer: Medicaid Other | Admitting: Obstetrics and Gynecology

## 2019-02-22 VITALS — BP 99/63 | HR 86 | Ht 71.0 in | Wt 196.0 lb

## 2019-02-22 DIAGNOSIS — K5903 Drug induced constipation: Secondary | ICD-10-CM | POA: Diagnosis not present

## 2019-02-22 DIAGNOSIS — T402X5A Adverse effect of other opioids, initial encounter: Secondary | ICD-10-CM | POA: Diagnosis not present

## 2019-02-22 DIAGNOSIS — K5909 Other constipation: Secondary | ICD-10-CM

## 2019-02-22 MED ORDER — LINACLOTIDE 290 MCG PO CAPS: 290.0000 ug | ORAL_CAPSULE | Freq: Every day | ORAL | 6 refills | Status: AC

## 2019-02-22 NOTE — Progress Notes (Signed)
Patient ID: Alexis [name], female   DOB: [birth year], 28 y.o.   MRN: [id number]    [place] [facility] Visit  @DATE @            Patient name: Alexis [name] MRN [id number]  Date of birth: March 20, 1991  CC & HPI:  [name] is a 28 y.o. female presenting today for pain in hernia in vaginal area and abdomen above navel. Painful voiding and bowel movement. After she is done using the bathroom  She feels as though something is falling out. Has been to e/r and hernia was bulging at vaginal entrance. Has 3 children and has Nexaplanon, needs it renewed later on this yearStill taking subutex 20 mg a day.  This is reportedly a reduced dose from earlier in the year She has concerned that her navel might be weak and have a hernia as well, reassured after exam ROS:  ROS she perceives the Linzess as having helped her constipation though it seems to be "less effective" now than it was when she first began on the 145 mg dose   Pertinent History Reviewed:   Reviewed: Significant for family history of chronic bowel problems Medical         Past Medical History:  Diagnosis Date  . ADD (attention deficit disorder)   . Angio-edema   . Anxiety   . Chlamydia 01/27/2019   Treated with azithromycin 500 mg 2 po now, 01/27/2019-----------POC_________  . Cystocele with uterine descensus    uterine descensus per OB/GYN history- no mention of cystocele, only uterine descensus  . Depression   . Gonorrhea 01/27/2019  . Insomnia   . Substance abuse ([place])   . Urticaria                               Surgical Hx:    Past Surgical History:  Procedure Laterality Date  . WISDOM TOOTH EXTRACTION     Medications: Reviewed & Updated - see associated section                       Current Outpatient Medications:  .  buprenorphine (SUBUTEX) 8 MG SUBL SL tablet, Place 8 mg under the tongue 3 (three) times daily. , Disp: , Rfl:  .  gabapentin (NEURONTIN) 300 MG capsule, Take 300 mg by mouth 3 (three) times  daily., Disp: , Rfl:  .  linaclotide (LINZESS) 145 MCG CAPS capsule, Take 1 capsule (145 mcg total) by mouth daily before breakfast. For chronic constipation, Disp: 30 capsule, Rfl: 2 .  QUEtiapine (SEROQUEL) 300 MG tablet, Take 300 mg by mouth at bedtime., Disp: , Rfl:  .  ALPRAZolam (XANAX) 0.5 MG tablet, Take 0.5 mg by mouth 2 (two) times daily as needed for anxiety., Disp: , Rfl:  .  azithromycin (ZITHROMAX) 500 MG tablet, Take 2 po now (Patient not taking: Reported on 01/28/2019), Disp: 2 tablet, Rfl: 0 .  metroNIDAZOLE (FLAGYL) 500 MG tablet, Take 1 tablet (500 mg total) by mouth 2 (two) times daily. (Patient not taking: Reported on 02/22/2019), Disp: 14 tablet, Rfl: 1 .  naproxen (NAPROSYN) 500 MG tablet, Take 1 tablet (500 mg total) by mouth 2 (two) times daily. (Patient not taking: Reported on 02/22/2019), Disp: 30 tablet, Rfl: 0   Social History: Reviewed -  reports that she has been smoking cigarettes. She has a 5.00 pack-year smoking history. She has never used smokeless tobacco.  Objective Findings:  Vitals: Blood pressure 99/63, pulse 86, height 5\' 11"  (1.803 m), weight 196 lb (88.9 kg), last menstrual period 02/02/2019.  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 Chronic constipation worsened by opiate use  P:  1. Increase Linzess (linaclotide) 290 mg before first meal of the day 2. Lengthy discussion of pros and cons of any surgical treatment at this young age.  I primarily feel her problem is related to her chronic  constipation and OIC, she does have first-degree uterine descensus and a small rectocele but if the bowel function can be improved I think the anatomic abnormalities can be overcome.  We will continue evaluation  By signing my name below, I, Arnette NorrisMari Johnson, attest that this documentation has been prepared under the direction and in the presence of Tilda BurrowFerguson, Lilliann Rossetti V, MD. Electronically Signed: Arnette NorrisMari Johnson Medical Scribe. 02/22/19. 12:39 PM.  I personally performed the services described in this documentation, which was SCRIBED in my presence. The recorded information has been reviewed and considered accurate. It has been edited as necessary during review. Tilda BurrowJohn V Taneisha Fuson, MD

## 2019-02-25 ENCOUNTER — Other Ambulatory Visit: Payer: Self-pay

## 2019-02-25 ENCOUNTER — Other Ambulatory Visit: Payer: Medicaid Other

## 2019-02-25 DIAGNOSIS — A749 Chlamydial infection, unspecified: Secondary | ICD-10-CM

## 2019-02-25 DIAGNOSIS — A549 Gonococcal infection, unspecified: Secondary | ICD-10-CM

## 2019-02-25 NOTE — Progress Notes (Signed)
Pt here for POC of GC/CHL. Urine sample sent to lab. [place]

## 2019-03-02 LAB — GC/CHLAMYDIA PROBE AMP
Chlamydia trachomatis, NAA: NEGATIVE
Neisseria Gonorrhoeae by PCR: NEGATIVE

## 2019-03-23 ENCOUNTER — Ambulatory Visit: Payer: Medicaid Other | Admitting: Obstetrics and Gynecology

## 2019-03-30 ENCOUNTER — Ambulatory Visit (INDEPENDENT_AMBULATORY_CARE_PROVIDER_SITE_OTHER): Payer: Medicaid Other | Admitting: Obstetrics and Gynecology

## 2019-03-30 ENCOUNTER — Encounter: Payer: Self-pay | Admitting: Obstetrics and Gynecology

## 2019-03-30 ENCOUNTER — Other Ambulatory Visit: Payer: Self-pay

## 2019-03-30 VITALS — BP 102/63 | HR 87 | Ht 71.0 in | Wt 200.2 lb

## 2019-03-30 DIAGNOSIS — Z113 Encounter for screening for infections with a predominantly sexual mode of transmission: Secondary | ICD-10-CM | POA: Diagnosis not present

## 2019-03-30 DIAGNOSIS — N816 Rectocele: Secondary | ICD-10-CM | POA: Diagnosis not present

## 2019-03-30 DIAGNOSIS — K5909 Other constipation: Secondary | ICD-10-CM | POA: Diagnosis not present

## 2019-03-30 NOTE — Progress Notes (Signed)
Patient ID: Alexis [name], female   DOB: 1991-01-01, 28 y.o.   MRN: [id number]   [place] [facility] Visit  @DATE @            Patient name: Alexis [name] MRN [id number]  Date of birth: 1991-05-19  CC & HPI:  TARREN [name] is a 28 y.o. female presenting today for f/u of contraception and vaginal and navel hernia. Would like std screen. Is taking higher dosage 290 mg of linzess and is having more frequent BM, still hard firm balls. Is taking subutex 8mg  twice 4 mg once a day. Hasn't had to self help defecate a few weeks ago. Has only had to self help defecate once in the past week. Has used miralax in the past but doesn't remember if it had any real effect to help  Had no choice, but to go to ex-boyfriends house after being kicked out of the shelter and wants to be tested for GCCHL   ROS:  ROS +chronic constipation +rectocele -fever   Pertinent History Reviewed:   Reviewed: Significant for OID Medical         Past Medical History:  Diagnosis Date   ADD (attention deficit disorder)    Angio-edema    Anxiety    Chlamydia 01/27/2019   Treated with azithromycin 500 mg 2 po now, 01/27/2019-----------POC_________   Cystocele with uterine descensus    uterine descensus per OB/GYN history- no mention of cystocele, only uterine descensus   Depression    Gonorrhea 01/27/2019   Insomnia    Substance abuse ([place])    Urticaria                               Surgical Hx:    Past Surgical History:  Procedure Laterality Date   WISDOM TOOTH EXTRACTION     Medications: Reviewed & Updated - see associated section                       Current Outpatient Medications:    ALPRAZolam (XANAX) 0.5 MG tablet, Take 0.5 mg by mouth 2 (two) times daily as needed for anxiety., Disp: , Rfl:    buprenorphine (SUBUTEX) 8 MG SUBL SL tablet, Place 8 mg under the tongue 3 (three) times daily. , Disp: , Rfl:    gabapentin (NEURONTIN) 300 MG capsule, Take 300 mg by mouth 3 (three)  times daily., Disp: , Rfl:    linaclotide (LINZESS) 290 MCG CAPS capsule, Take 1 capsule (290 mcg total) by mouth daily before breakfast., Disp: 30 capsule, Rfl: 6   lisdexamfetamine (VYVANSE) 60 MG capsule, Take 60 mg by mouth every morning., Disp: , Rfl:    QUEtiapine (SEROQUEL) 300 MG tablet, Take 300 mg by mouth at bedtime., Disp: , Rfl:    Social History: Reviewed -  reports that she has been smoking cigarettes. She has a 5.00 pack-year smoking history. She has never used smokeless tobacco. Theta was living in a homeless shelter for women, but had to leave due to concerns as her former boyfriend was seen driving past (patient version).  She left and was therefore essentially homeless since and that she is now living again with the same boyfriend and sexually active.  Still has Nexplanon implants Objective Findings:  Vitals: Blood pressure 102/63, pulse 87, height 5\' 11"  (1.803 m), weight 200 lb 3.2 oz (90.8 kg), last menstrual period 02/27/2019.  PHYSICAL EXAMINATION General appearance -  alert, well appearing, and in no distress Mental status - alert, oriented to person, place, and time, normal mood, behavior, speech, dress, motor activity, and thought processes, affect appropriate to mood  Abdomen - 2 cm early paraumbilical hernia, not deep.  PELVIC Vagina - moderate rectocele flips forward almost 90 derees high in vagina Cervix - not examined Uterus - palpated GCCHL collected  Assessment & Plan:   A:  1.  Rectocele 2. Chronic constipation OIC 3. And unstable living circumstance  P:  1. Continue linzess and add miralaex 2. Unsure of future conception after Nexplanon 3. STI screen    By signing my name below, I, Arnette NorrisMari Johnson, attest that this documentation has been prepared under the direction and in the presence of Tilda BurrowFerguson, John V, MD. Electronically Signed: Arnette NorrisMari Johnson Medical Scribe. 03/30/19. 12:16 PM.  I personally performed the services described in this  documentation, which was SCRIBED in my presence. The recorded information has been reviewed and considered accurate. It has been edited as necessary during review. Tilda BurrowJohn V Ferguson, MD

## 2019-04-03 LAB — GC/CHLAMYDIA PROBE AMP
Chlamydia trachomatis, NAA: NEGATIVE
Neisseria Gonorrhoeae by PCR: NEGATIVE

## 2019-05-11 ENCOUNTER — Ambulatory Visit (INDEPENDENT_AMBULATORY_CARE_PROVIDER_SITE_OTHER): Payer: Medicaid Other | Admitting: Advanced Practice Midwife

## 2019-05-11 ENCOUNTER — Encounter: Payer: Self-pay | Admitting: Advanced Practice Midwife

## 2019-05-11 ENCOUNTER — Other Ambulatory Visit: Payer: Self-pay

## 2019-05-11 VITALS — BP 128/75 | HR 87 | Ht 71.0 in | Wt 188.0 lb

## 2019-05-11 DIAGNOSIS — Z30017 Encounter for initial prescription of implantable subdermal contraceptive: Secondary | ICD-10-CM | POA: Diagnosis not present

## 2019-05-11 DIAGNOSIS — Z113 Encounter for screening for infections with a predominantly sexual mode of transmission: Secondary | ICD-10-CM | POA: Diagnosis not present

## 2019-05-11 DIAGNOSIS — Z3202 Encounter for pregnancy test, result negative: Secondary | ICD-10-CM

## 2019-05-11 LAB — POCT URINE PREGNANCY: Preg Test, Ur: NEGATIVE

## 2019-05-11 MED ORDER — ETONOGESTREL 68 MG ~~LOC~~ IMPL
68.0000 mg | DRUG_IMPLANT | Freq: Once | SUBCUTANEOUS | Status: AC
  Administered 2019-05-11: 10:00:00 68 mg via SUBCUTANEOUS

## 2019-05-11 NOTE — Progress Notes (Signed)
   [place] REMOVAL AND RE-INSERTION Patient name: Alexis [name] MRN [id number]  Date of birth: November 16, 1990 Subjective Findings:   Alexis [name] is a 28 y.o. [phone] Caucasian female being seen today for Nexplanon removal and re-insertion. Her Nexplanon was placed originally in her left arm.   Patient's last menstrual period was 05/01/2019 (approximate). Last pap 01/2018. Results were:  normal, neg HPV  Risks/benefits/side effects of Nexplanon have been discussed and her questions have been answered.  Specifically, a failure rate of 08/998 has been reported, with an increased failure rate if pt takes Osborne and/or antiseizure medicaitons.  She is aware of the common side effect of irregular bleeding, which the incidence of decreases over time. Signed copy of informed consent in chart.  Pertinent History Reviewed:   Reviewed past medical,surgical, social, obstetrical and family history.  Reviewed problem list, medications and allergies. Objective Findings & Procedure:    Vitals:   05/11/19 0841  BP: 128/75  Pulse: 87  Weight: 188 lb (85.3 kg)  Height: 5\' 11"  (1.803 m)  Body mass index is 26.22 kg/m.  Results for orders placed or performed in visit on 05/11/19 (from the past 24 hour(s))  POCT urine pregnancy   Collection Time: 05/11/19  8:51 AM  Result Value Ref Range   Preg Test, Ur Negative Negative     Time out was performed.  Nexplanon site identified.  Area prepped in usual sterile fashon. Two cc's of 2% lidocaine was used to anesthetize the area. A small stab incision was made right beside the implant on the distal portion.  The Nexplanon rod was grasped using hemostats and removed intact without difficulty.  The area was cleansed again with betadine and the Nexplanon was inserted approximately 10cm from the medial epicondyle and 3-5cm posterior to the sulcus per manufacturer's recommendations without difficulty.  Steri-strips and a pressure bandage was applied.   There was less than 3 cc blood loss. There were no complications.  The patient tolerated the procedure well.  EXP: [id number] LOT: [id number]  Procedure performed with hands-on assistance from [facility] NP. Assessment & Plan:   1) Nexplanon removal & re-insertion She was instructed to keep the area clean and dry, remove pressure bandage in 24 hours, and keep insertion site covered with the steri-strips for 3-5 days.  She was given a card indicating date Nexplanon was inserted and date it needs to be removed.  Follow-up PRN problems.  Orders Placed This Encounter  Procedures  . GC/Chlamydia Probe Amp  . POCT urine pregnancy    Follow-up: Return in about 1 year (around 05/10/2020) for Physical.  Myrtis Ser [facility] 05/11/2019 9:19 AM

## 2019-05-11 NOTE — Patient Instructions (Signed)
Etonogestrel implant What is this medicine? ETONOGESTREL (et oh noe JES trel) is a contraceptive (birth control) device. It is used to prevent pregnancy. It can be used for up to 3 years. This medicine may be used for other purposes; ask your health care provider or pharmacist if you have questions. COMMON BRAND NAME(S): Implanon, Nexplanon What should I tell my health care provider before I take this medicine? They need to know if you have any of these conditions:  abnormal vaginal bleeding  blood vessel disease or blood clots  breast, cervical, endometrial, ovarian, liver, or uterine cancer  diabetes  gallbladder disease  heart disease or recent heart attack  high blood pressure  high cholesterol or triglycerides  kidney disease  liver disease  migraine headaches  seizures  stroke  tobacco smoker  an unusual or allergic reaction to etonogestrel, anesthetics or antiseptics, other medicines, foods, dyes, or preservatives  pregnant or trying to get pregnant  breast-feeding How should I use this medicine? This device is inserted just under the skin on the inner side of your upper arm by a health care professional. Talk to your pediatrician regarding the use of this medicine in children. Special care may be needed. Overdosage: If you think you have taken too much of this medicine contact a poison control center or emergency room at once. NOTE: This medicine is only for you. Do not share this medicine with others. What if I miss a dose? This does not apply. What may interact with this medicine? Do not take this medicine with any of the following medications:  amprenavir  fosamprenavir This medicine may also interact with the following medications:  acitretin  aprepitant  armodafinil  bexarotene  bosentan  carbamazepine  certain medicines for fungal infections like fluconazole, ketoconazole, itraconazole and voriconazole  certain medicines to treat  hepatitis, HIV or AIDS  cyclosporine  felbamate  griseofulvin  lamotrigine  modafinil  oxcarbazepine  phenobarbital  phenytoin  primidone  rifabutin  rifampin  rifapentine  St. John's wort  topiramate This list may not describe all possible interactions. Give your health care provider a list of all the medicines, herbs, non-prescription drugs, or dietary supplements you use. Also tell them if you smoke, drink alcohol, or use illegal drugs. Some items may interact with your medicine. What should I watch for while using this medicine? This product does not protect you against HIV infection (AIDS) or other sexually transmitted diseases. You should be able to feel the implant by pressing your fingertips over the skin where it was inserted. Contact your doctor if you cannot feel the implant, and use a non-hormonal birth control method (such as condoms) until your doctor confirms that the implant is in place. Contact your doctor if you think that the implant may have broken or become bent while in your arm. You will receive a user card from your health care provider after the implant is inserted. The card is a record of the location of the implant in your upper arm and when it should be removed. Keep this card with your health records. What side effects may I notice from receiving this medicine? Side effects that you should report to your doctor or health care professional as soon as possible:  allergic reactions like skin rash, itching or hives, swelling of the face, lips, or tongue  breast lumps, breast tissue changes, or discharge  breathing problems  changes in emotions or moods  if you feel that the implant may have broken or   bent while in your arm  high blood pressure  pain, irritation, swelling, or bruising at the insertion site  scar at site of insertion  signs of infection at the insertion site such as fever, and skin redness, pain or discharge  signs and  symptoms of a blood clot such as breathing problems; changes in vision; chest pain; severe, sudden headache; pain, swelling, warmth in the leg; trouble speaking; sudden numbness or weakness of the face, arm or leg  signs and symptoms of liver injury like dark yellow or brown urine; general ill feeling or flu-like symptoms; light-colored stools; loss of appetite; nausea; right upper belly pain; unusually weak or tired; yellowing of the eyes or skin  unusual vaginal bleeding, discharge Side effects that usually do not require medical attention (report to your doctor or health care professional if they continue or are bothersome):  acne  breast pain or tenderness  headache  irregular menstrual bleeding  nausea This list may not describe all possible side effects. Call your doctor for medical advice about side effects. You may report side effects to FDA at 1-800-FDA-1088. Where should I keep my medicine? This drug is given in a hospital or clinic and will not be stored at home. NOTE: This sheet is a summary. It may not cover all possible information. If you have questions about this medicine, talk to your doctor, pharmacist, or health care provider.  2020 Elsevier/Gold Standard (2017-06-09 14:11:42) Nexplanon Instructions After Insertion   Keep bandage clean and dry for 24 hours   May use ice/Tylenol/Ibuprofen for soreness or pain   If you develop fever, drainage or increased warmth from incision site-contact office immediately   

## 2019-05-13 LAB — GC/CHLAMYDIA PROBE AMP
Chlamydia trachomatis, NAA: NEGATIVE
Neisseria Gonorrhoeae by PCR: NEGATIVE

## 2019-06-18 ENCOUNTER — Encounter (HOSPITAL_COMMUNITY): Payer: Self-pay

## 2019-06-18 ENCOUNTER — Emergency Department (HOSPITAL_COMMUNITY): Payer: Medicaid Other

## 2019-06-18 ENCOUNTER — Emergency Department (HOSPITAL_COMMUNITY)
Admission: EM | Admit: 2019-06-18 | Discharge: 2019-06-19 | Disposition: A | Discharge status: Home / Self Care | Payer: Medicaid Other | Attending: Emergency Medicine | Admitting: Emergency Medicine

## 2019-06-18 DIAGNOSIS — J9811 Atelectasis: Secondary | ICD-10-CM | POA: Diagnosis not present

## 2019-06-18 DIAGNOSIS — Y9389 Activity, other specified: Secondary | ICD-10-CM | POA: Insufficient documentation

## 2019-06-18 DIAGNOSIS — R52 Pain, unspecified: Secondary | ICD-10-CM | POA: Diagnosis not present

## 2019-06-18 DIAGNOSIS — K310 Acute dilatation of stomach: Secondary | ICD-10-CM | POA: Diagnosis not present

## 2019-06-18 DIAGNOSIS — Y998 Other external cause status: Secondary | ICD-10-CM | POA: Diagnosis not present

## 2019-06-18 DIAGNOSIS — F17210 Nicotine dependence, cigarettes, uncomplicated: Secondary | ICD-10-CM | POA: Diagnosis not present

## 2019-06-18 DIAGNOSIS — Y9289 Other specified places as the place of occurrence of the external cause: Secondary | ICD-10-CM | POA: Insufficient documentation

## 2019-06-18 DIAGNOSIS — S0101XA Laceration without foreign body of scalp, initial encounter: Secondary | ICD-10-CM | POA: Insufficient documentation

## 2019-06-18 LAB — COMPREHENSIVE METABOLIC PANEL
ALT: 13 U/L (ref 0–44)
AST: 20 U/L (ref 15–41)
Albumin: 3.7 g/dL (ref 3.5–5.0)
Alkaline Phosphatase: 45 U/L (ref 38–126)
Anion gap: 10 (ref 5–15)
BUN: 7 mg/dL (ref 6–20)
CO2: 22 mmol/L (ref 22–32)
Calcium: 8.9 mg/dL (ref 8.9–10.3)
Chloride: 108 mmol/L (ref 98–111)
Creatinine, Ser: 0.95 mg/dL (ref 0.44–1.00)
GFR calc Af Amer: >60 mL/min (ref >60)
GFR calc non Af Amer: >60 mL/min (ref >60)
Glucose, Bld: 93 mg/dL (ref 70–99)
Potassium: 3.6 mmol/L (ref 3.5–5.1)
Sodium: 140 mmol/L (ref 135–145)
Total Bilirubin: 0.9 mg/dL (ref 0.3–1.2)
Total Protein: 6.2 g/dL — ABNORMAL LOW (ref 6.5–8.1)

## 2019-06-18 LAB — CBC
HCT: 39.3 % (ref 36.0–46.0)
Hemoglobin: 13.1 g/dL (ref 12.0–15.0)
MCH: 31.6 pg (ref 26.0–34.0)
MCHC: 33.3 g/dL (ref 30.0–36.0)
MCV: 94.7 fL (ref 80.0–100.0)
Platelets: 211 10*3/uL (ref 150–400)
RBC: 4.15 MIL/uL (ref 3.87–5.11)
RDW: 12 % (ref 11.5–15.5)
WBC: 5.6 10*3/uL (ref 4.0–10.5)
nRBC: 0 % (ref 0.0–0.2)

## 2019-06-18 LAB — I-STAT CHEM 8, ED
BUN: 7 mg/dL (ref 6–20)
Calcium, Ion: 1.2 mmol/L (ref 1.15–1.40)
Chloride: 104 mmol/L (ref 98–111)
Creatinine, Ser: 0.9 mg/dL (ref 0.44–1.00)
Glucose, Bld: 88 mg/dL (ref 70–99)
HCT: 42 % (ref 36.0–46.0)
Hemoglobin: 14.3 g/dL (ref 12.0–15.0)
Potassium: 3.6 mmol/L (ref 3.5–5.1)
Sodium: 141 mmol/L (ref 135–145)
TCO2: 24 mmol/L (ref 22–32)

## 2019-06-18 LAB — I-STAT BETA HCG BLOOD, ED (MC, WL, AP ONLY): I-stat hCG, quantitative: <5 m[IU]/mL (ref <5)

## 2019-06-18 LAB — ETHANOL: Alcohol, Ethyl (B): <10 mg/dL (ref <10)

## 2019-06-18 MED ORDER — FENTANYL CITRATE (PF) 100 MCG/2ML IJ SOLN
50.0000 ug | Freq: Once | INTRAMUSCULAR | Status: AC
  Administered 2019-06-18: 50 ug via INTRAVENOUS
  Filled 2019-06-18: qty 2

## 2019-06-18 MED ORDER — IOHEXOL 300 MG/ML  SOLN
100.0000 mL | Freq: Once | INTRAMUSCULAR | Status: AC | PRN
  Administered 2019-06-18: 100 mL via INTRAVENOUS

## 2019-06-18 MED ORDER — LIDOCAINE-EPINEPHRINE (PF) 2 %-1:200000 IJ SOLN
20.0000 mL | Freq: Once | INTRAMUSCULAR | Status: AC
  Administered 2019-06-18: 20 mL
  Filled 2019-06-18: qty 20

## 2019-06-18 MED ORDER — SODIUM CHLORIDE 0.9 % IV BOLUS
1000.0000 mL | Freq: Once | INTRAVENOUS | Status: AC
  Administered 2019-06-18: 22:00:00 1000 mL via INTRAVENOUS
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
## 2019-06-19 NOTE — Discharge Instructions (Signed)
Luckily there were no injuries found on imaging.  The radiologist did notice an incidental finding of a lung nodule on your CT scan.  This is something that if you smoke or have a family history of cancer that your family doctor should reimage in a year for possible changes.    You will hurt worse tomorrow, this is normal, please return for shortness of breath, confusion, persistent vomiting.
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
## 2019-06-19 NOTE — ED Notes (Signed)
Pt ambulated to bathroom from stretcher without dizziness. Does not have any phone numbers of anyone for a ride home-- lives in [place]-- will place social work consult.

## 2019-06-19 NOTE — ED Notes (Signed)
Pt moving around on the stretcher

## 2019-06-19 NOTE — ED Notes (Signed)
Patient verbalizes understanding of discharge instructions. Opportunity for questioning and answers were provided. Armband removed by staff, pt discharged from ED via taxi.

## 2019-06-19 NOTE — ED Notes (Signed)
Pt roused  Up and she was told that she would need to call someone to pick her up

## 2019-06-19 NOTE — ED Notes (Signed)
Gave pt an "ER Happy Meal" and Ginger Ale, per Jenny Reichmann - RN.

## 2019-07-06 DIAGNOSIS — Z5181 Encounter for therapeutic drug level monitoring: Secondary | ICD-10-CM | POA: Diagnosis not present

## 2019-07-07 ENCOUNTER — Other Ambulatory Visit: Payer: Self-pay

## 2019-07-07 ENCOUNTER — Emergency Department (HOSPITAL_COMMUNITY)
Admission: EM | Admit: 2019-07-07 | Discharge: 2019-07-07 | Disposition: A | Discharge status: Home / Self Care | Payer: Medicaid Other | Attending: Emergency Medicine | Admitting: Emergency Medicine

## 2019-07-07 ENCOUNTER — Encounter (HOSPITAL_COMMUNITY): Payer: Self-pay | Admitting: *Deleted

## 2019-07-07 DIAGNOSIS — Z79899 Other long term (current) drug therapy: Secondary | ICD-10-CM | POA: Insufficient documentation

## 2019-07-07 DIAGNOSIS — Z4802 Encounter for removal of sutures: Secondary | ICD-10-CM

## 2019-07-07 DIAGNOSIS — X58XXXD Exposure to other specified factors, subsequent encounter: Secondary | ICD-10-CM | POA: Diagnosis not present

## 2019-07-07 DIAGNOSIS — S0101XD Laceration without foreign body of scalp, subsequent encounter: Secondary | ICD-10-CM | POA: Insufficient documentation

## 2019-07-07 DIAGNOSIS — F17210 Nicotine dependence, cigarettes, uncomplicated: Secondary | ICD-10-CM | POA: Insufficient documentation

## 2019-07-07 NOTE — ED Triage Notes (Signed)
Here for staple removal to left scalp.

## 2019-07-07 NOTE — Discharge Instructions (Addendum)
Keep clean and dry

## 2019-07-07 NOTE — ED Provider Notes (Signed)
[facility] EMERGENCY DEPARTMENT Provider Note   CSN: [id number] Arrival date & time: 07/07/19  1108     History   Chief Complaint Chief Complaint  Patient presents with  . Suture / Staple Removal    HPI Alexis [name] is a 28 y.o. female.     Status post stapling x4 on 06/18/2021 left parietal area.  No complaints.     Past Medical History:  Diagnosis Date  . ADD (attention deficit disorder)   . Angio-edema   . Anxiety   . Chlamydia 01/27/2019   Treated with azithromycin 500 mg 2 po now, 01/27/2019-----------POC_________  . Cystocele with uterine descensus    uterine descensus per OB/GYN history- no mention of cystocele, only uterine descensus  . Depression   . Gonorrhea 01/27/2019  . Insomnia   . Substance abuse ([place])   . Urticaria     Patient Active Problem List   Diagnosis Date Noted  . Gonorrhea 01/27/2019  . Chlamydia 01/27/2019  . Drug dependence 09/30/2018  . Therapeutic opioid-induced constipation (OIC) 09/07/2018  . Pregnancy complicated by subutex maintenance, antepartum ([place]) 04/18/2016  . Preterm premature rupture of membranes (PPROM) at [redacted]w[redacted]d with unknown onset of labor 04/17/2016    Past Surgical History:  Procedure Laterality Date  . WISDOM TOOTH EXTRACTION       OB History    Gravida  3   Para  3   Term  1   Preterm  2   AB      Living  3     SAB      TAB      Ectopic      Multiple  0   Live Births  3            Home Medications    Prior to Admission medications   Medication Sig Start Date End Date Taking? Authorizing Provider  ALPRAZolam (XANAX) 0.5 MG tablet 1 mg 2 (two) times daily as needed for anxiety.     [provider]  buprenorphine (SUBUTEX) 8 MG SUBL SL tablet Place 8 mg under the tongue 3 (three) times daily.     [provider]  gabapentin (NEURONTIN) 300 MG capsule Take 300 mg by mouth 3 (three) times daily.    [provider]  linaclotide Rolan Lipa) 290 MCG CAPS  capsule Take 1 capsule (290 mcg total) by mouth daily before breakfast. 02/22/19   Jonnie Kind, MD  lisdexamfetamine (VYVANSE) 60 MG capsule Take 60 mg by mouth every morning.    [provider]  QUEtiapine (SEROQUEL) 300 MG tablet Take 300 mg by mouth at bedtime.    [provider]    Family History Family History  Problem Relation Age of Onset  . Depression Paternal Grandmother   . Hypertension Maternal Grandmother   . Cancer Maternal Grandmother        skin  . Hypertension Mother   . Eczema Daughter   . Allergic rhinitis Neg Hx   . Angioedema Neg Hx   . Asthma Neg Hx   . Atopy Neg Hx   . Immunodeficiency Neg Hx   . Urticaria Neg Hx     Social History Social History   Tobacco Use  . Smoking status: Current Every Day Smoker    Packs/day: 0.50    Years: 10.00    Pack years: 5.00    Types: Cigarettes  . Smokeless tobacco: Never Used  . Tobacco comment: 1 whole pack will last 1-2 weeks most  of the time.   Substance Use Topics  . Alcohol use: Not Currently    Comment: very rare glass of wine.   . Drug use: Not Currently    Types: Heroin, Cocaine, "Crack" cocaine     Allergies   Naloxone, Tramadol, and Vicodin [hydrocodone-acetaminophen]   Review of Systems Review of Systems  All other systems reviewed and are negative.    Physical Exam Updated Vital Signs BP 110/70 (BP Location: Right Arm)   Pulse 88   Temp 98.6 F (37 C) (Oral)   Resp 12   Ht 5\' 11"  (1.803 m)   Wt 84.4 kg   LMP 06/23/2019   SpO2 100%   BMI 25.94 kg/m   Physical Exam Vitals signs and nursing note reviewed.  Constitutional:      Appearance: She is well-developed.  HENT:     Head: Normocephalic.     Comments: 4 staples in left parietal area Eyes:     Conjunctiva/sclera: Conjunctivae normal.  Pulmonary:     Effort: Pulmonary effort is normal.  Musculoskeletal: Normal range of motion.  Skin:    General: Skin is warm and dry.  Neurological:     General: No  focal deficit present.     Mental Status: She is alert and oriented to person, place, and time.  Psychiatric:        Behavior: Behavior normal.      ED Treatments / Results  Labs (all labs ordered are listed, but only abnormal results are displayed) Labs Reviewed - No data to display  EKG None  Radiology No results found.  Procedures Procedures (including critical care time)  Medications Ordered in ED Medications - No data to display   Initial Impression / Assessment and Plan / ED Course  I have reviewed the triage vital signs and the nursing notes.  Pertinent labs & imaging results that were available during my care of the patient were reviewed by me and considered in my medical decision making (see chart for details).        Staple removal per RN.  Healing well.  Final Clinical Impressions(s) / ED Diagnoses   Final diagnoses:  Encounter for staple removal    ED Discharge Orders    None       06/25/2019, MD 07/07/19 1151

## 2019-07-11 DIAGNOSIS — Z5181 Encounter for therapeutic drug level monitoring: Secondary | ICD-10-CM | POA: Diagnosis not present

## 2019-07-11 DIAGNOSIS — Z7689 Persons encountering health services in other specified circumstances: Secondary | ICD-10-CM | POA: Diagnosis not present

## 2019-07-11 DIAGNOSIS — F411 Generalized anxiety disorder: Secondary | ICD-10-CM | POA: Diagnosis not present

## 2019-07-11 DIAGNOSIS — F909 Attention-deficit hyperactivity disorder, unspecified type: Secondary | ICD-10-CM | POA: Diagnosis not present

## 2019-07-13 DIAGNOSIS — Z5181 Encounter for therapeutic drug level monitoring: Secondary | ICD-10-CM | POA: Diagnosis not present

## 2019-07-14 DIAGNOSIS — Z7689 Persons encountering health services in other specified circumstances: Secondary | ICD-10-CM | POA: Diagnosis not present

## 2019-07-20 DIAGNOSIS — Z5181 Encounter for therapeutic drug level monitoring: Secondary | ICD-10-CM | POA: Diagnosis not present

## 2019-07-25 ENCOUNTER — Other Ambulatory Visit: Payer: Self-pay

## 2019-07-25 ENCOUNTER — Ambulatory Visit (INDEPENDENT_AMBULATORY_CARE_PROVIDER_SITE_OTHER): Payer: Medicaid Other | Admitting: Obstetrics and Gynecology

## 2019-07-25 ENCOUNTER — Encounter: Payer: Self-pay | Admitting: Obstetrics and Gynecology

## 2019-07-25 VITALS — BP 118/78 | HR 83 | Ht 71.0 in | Wt 191.4 lb

## 2019-07-25 DIAGNOSIS — N941 Unspecified dyspareunia: Secondary | ICD-10-CM

## 2019-07-25 DIAGNOSIS — N854 Malposition of uterus: Secondary | ICD-10-CM

## 2019-07-25 NOTE — Progress Notes (Signed)
Patient ID: Alexis [name], female   DOB: [birth year], 28 y.o.   MRN: [id number]    [facility] Visit  @DATE @            Patient name: Alexis [name] MRN [name]  Date of birth: 17-Dec-1990  CC & HPI:  Alexis [name] is a 28 y.o. female [phone] presenting today for f/u of 'hernia', referring to prior eval showing pelvic relaxation and small rectocele.. She has been able to use the restroom more without using linzess as much and straining causes it to hurt more. Her biggest baby was her first who was 8 lbs. She is able to see her kids, she recently saw her son. She is living with her uncle and his wife, so she can have a stable home and transportation. She hasn't seen her partner in 1 month, so not currently sexually active  ROS:  ROS +uterine retroversion +dyspaurenia , deep thrust contact +pelvic pain Otherwise negative other than HPI Pertinent History Reviewed:   Reviewed:  Medical         Past Medical History:  Diagnosis Date  . ADD (attention deficit disorder)   . Angio-edema   . Anxiety   . Chlamydia 01/27/2019   Treated with azithromycin 500 mg 2 po now, 01/27/2019-----------POC_________  . Cystocele with uterine descensus    uterine descensus per OB/GYN history- no mention of cystocele, only uterine descensus  . Depression   . Gonorrhea 01/27/2019  . Insomnia   . Substance abuse (HCC)   . Urticaria                               Surgical Hx:    Past Surgical History:  Procedure Laterality Date  . WISDOM TOOTH EXTRACTION     Medications: Reviewed & Updated - see associated section                       Current Outpatient Medications:  .  ALPRAZolam (XANAX) 0.5 MG tablet, 1 mg 2 (two) times daily as needed for anxiety. , Disp: , Rfl:  .  buprenorphine (SUBUTEX) 8 MG SUBL SL tablet, Place 8 mg under the tongue 3 (three) times daily. , Disp: , Rfl:  .  gabapentin (NEURONTIN) 300 MG capsule, Take 300 mg by mouth 3 (three) times daily., Disp: , Rfl:   .  Lansoprazole (PREVACID PO), Take by mouth as needed., Disp: , Rfl:  .  linaclotide (LINZESS) 290 MCG CAPS capsule, Take 1 capsule (290 mcg total) by mouth daily before breakfast., Disp: 30 capsule, Rfl: 6 .  lisdexamfetamine (VYVANSE) 60 MG capsule, Take 60 mg by mouth every morning., Disp: , Rfl:  .  QUEtiapine (SEROQUEL) 300 MG tablet, Take 300 mg by mouth at bedtime., Disp: , Rfl:    Social History: Reviewed -  reports that she has been smoking cigarettes. She has a 5.00 pack-year smoking history. She has never used smokeless tobacco.  Objective Findings:  Vitals: Blood pressure 118/78, pulse 83, height 5\' 11"  (1.803 m), weight 191 lb 6.4 oz (86.8 kg).  PHYSICAL EXAMINATION General appearance - alert, well appearing, and in no distress Mental status - normal mood, behavior, speech, dress, motor activity, and thought processes, affect appropriate to mood  PELVIC Vagina - good support Cervix - descends 3 cm to introitus Uterus - 1st degree uterine descensus, uterine retroversion  Assessment & Plan:   A:  1.  Pelvic pain due to uterine retroversion 2. Dyspareunia   P:  1.  NSAID before intercourse 2  Self awareness by self exam. 3  Contraception decisions being considered. May consider permanent sterilization.   By signing my name below, I, Alexis [name], attest that this documentation has been prepared under the direction and in the presence of Jonnie Kind, MD. Electronically Signed: [place]. 07/25/19. 1:59 PM.   I personally performed the services described in this documentation, which was SCRIBED in my presence. The recorded information has been reviewed and considered accurate. It has been edited as necessary during review. Jonnie Kind, MD  }

## 2019-08-08 DIAGNOSIS — G5790 Unspecified mononeuropathy of unspecified lower limb: Secondary | ICD-10-CM | POA: Diagnosis not present

## 2019-08-08 DIAGNOSIS — F514 Sleep terrors [night terrors]: Secondary | ICD-10-CM | POA: Diagnosis not present

## 2019-08-08 DIAGNOSIS — F4310 Post-traumatic stress disorder, unspecified: Secondary | ICD-10-CM | POA: Diagnosis not present

## 2019-08-08 DIAGNOSIS — F909 Attention-deficit hyperactivity disorder, unspecified type: Secondary | ICD-10-CM | POA: Diagnosis not present

## 2019-09-05 DIAGNOSIS — F909 Attention-deficit hyperactivity disorder, unspecified type: Secondary | ICD-10-CM | POA: Diagnosis not present

## 2019-09-05 DIAGNOSIS — F514 Sleep terrors [night terrors]: Secondary | ICD-10-CM | POA: Diagnosis not present

## 2019-10-05 DIAGNOSIS — F514 Sleep terrors [night terrors]: Secondary | ICD-10-CM | POA: Diagnosis not present

## 2019-10-05 DIAGNOSIS — F909 Attention-deficit hyperactivity disorder, unspecified type: Secondary | ICD-10-CM | POA: Diagnosis not present

## 2019-10-05 DIAGNOSIS — Z5181 Encounter for therapeutic drug level monitoring: Secondary | ICD-10-CM | POA: Diagnosis not present

## 2019-10-10 NOTE — Telephone Encounter (Signed)
Jvf to work on scheduling followup once pt responds.

## 2019-10-10 NOTE — Telephone Encounter (Signed)
Left message on Cell for pt to read MyChart, and respond. Last note indicates there is consideration of tubal vs more extensive surgery to address sterilization. Pt has uterine retroversion and pelvic pain and dyspareunia that is related to uterine position, and we discussed those complaints would be better served by vaginal hysterectomy . There is also a small rectocele. Will complete MyChart note as well.

## 2019-10-12 DIAGNOSIS — Z5181 Encounter for therapeutic drug level monitoring: Secondary | ICD-10-CM | POA: Diagnosis not present

## 2019-10-27 DIAGNOSIS — Z5181 Encounter for therapeutic drug level monitoring: Secondary | ICD-10-CM | POA: Diagnosis not present

## 2019-11-03 ENCOUNTER — Encounter: Payer: Self-pay | Admitting: Obstetrics and Gynecology

## 2019-11-03 ENCOUNTER — Ambulatory Visit (INDEPENDENT_AMBULATORY_CARE_PROVIDER_SITE_OTHER): Payer: Medicaid Other | Admitting: Obstetrics and Gynecology

## 2019-11-03 ENCOUNTER — Other Ambulatory Visit: Payer: Self-pay

## 2019-11-03 VITALS — BP 113/60 | HR 104 | Ht 71.0 in | Wt 182.8 lb

## 2019-11-03 DIAGNOSIS — N854 Malposition of uterus: Secondary | ICD-10-CM | POA: Diagnosis not present

## 2019-11-03 MED ORDER — ESTRADIOL 0.5 MG PO TABS: 1.0000 mg | ORAL_TABLET | Freq: Two times a day (BID) | ORAL | 1 refills | Status: DC
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
## 2019-11-04 ENCOUNTER — Other Ambulatory Visit: Payer: Self-pay | Admitting: Obstetrics and Gynecology

## 2019-11-04 MED ORDER — ESTRADIOL 0.5 MG PO TABS: 0.5000 mg | ORAL_TABLET | Freq: Two times a day (BID) | ORAL | 1 refills | Status: DC

## 2019-11-04 NOTE — Progress Notes (Signed)
Estradiol 0.5 mg bid for breakthru bleeding on Nexplanon sent in.

## 2019-11-07 DIAGNOSIS — F514 Sleep terrors [night terrors]: Secondary | ICD-10-CM | POA: Diagnosis not present

## 2019-11-07 DIAGNOSIS — F909 Attention-deficit hyperactivity disorder, unspecified type: Secondary | ICD-10-CM | POA: Diagnosis not present

## 2019-11-09 DIAGNOSIS — Z5181 Encounter for therapeutic drug level monitoring: Secondary | ICD-10-CM | POA: Diagnosis not present

## 2019-11-10 ENCOUNTER — Ambulatory Visit (INDEPENDENT_AMBULATORY_CARE_PROVIDER_SITE_OTHER): Payer: Medicaid Other | Admitting: Obstetrics and Gynecology

## 2019-11-10 DIAGNOSIS — Z5329 Procedure and treatment not carried out because of patient's decision for other reasons: Secondary | ICD-10-CM

## 2019-11-10 NOTE — Progress Notes (Signed)
No show for pelvic.

## 2019-11-23 ENCOUNTER — Ambulatory Visit (INDEPENDENT_AMBULATORY_CARE_PROVIDER_SITE_OTHER): Payer: Medicaid Other | Admitting: Obstetrics and Gynecology

## 2019-11-23 ENCOUNTER — Other Ambulatory Visit (HOSPITAL_COMMUNITY)
Admission: RE | Admit: 2019-11-23 | Discharge: 2019-11-23 | Disposition: A | Discharge status: Home / Self Care | Payer: Medicaid Other | Source: Ambulatory Visit | Attending: Obstetrics and Gynecology | Admitting: Obstetrics and Gynecology

## 2019-11-23 ENCOUNTER — Encounter: Payer: Self-pay | Admitting: Obstetrics and Gynecology

## 2019-11-23 ENCOUNTER — Other Ambulatory Visit: Payer: Self-pay

## 2019-11-23 VITALS — BP 95/51 | HR 81 | Ht 71.0 in | Wt 179.8 lb

## 2019-11-23 DIAGNOSIS — Z124 Encounter for screening for malignant neoplasm of cervix: Secondary | ICD-10-CM | POA: Diagnosis not present

## 2019-11-23 DIAGNOSIS — Z5181 Encounter for therapeutic drug level monitoring: Secondary | ICD-10-CM | POA: Diagnosis not present

## 2019-11-23 DIAGNOSIS — Z3002 Counseling and instruction in natural family planning to avoid pregnancy: Secondary | ICD-10-CM

## 2019-11-23 NOTE — Progress Notes (Signed)
Patient ID: RAELEE [name], female   DOB: 06/12/1991, 29 y.o.   MRN: [id number]    [facility] Visit  11/23/19           Patient name: Alexis [name] MRN [id number]  Date of birth: 1991-06-13  CC & HPI:  Alexis [name] is a 29 y.o. female presenting today for a hysterectomy discussion and pelvic exam.  Please see the notes from her last visit which were extensive today's visit is just fine on type of hysterectomy vaginally or abdominal.  She is a gravida 3 para 3 She reports that I can on occasion if she does not take Linzess she will become so constipated that she has had on occasion to digitally remove the impaction.  She has a sensation of fullness just inside the vaginal entrance, consistent with rectocele  She has been on her period. She notes that she took two estradiol at once, which seemed to work a lot better than taking it just twice per day for the breakthrough bleeding.  ROS:  Review of Systems  Constitutional: Negative for diaphoresis, fever, malaise/fatigue and weight loss.  HENT: Negative for congestion and sore throat.   Eyes: Negative for blurred vision and double vision.  Respiratory: Negative for cough and shortness of breath.   Cardiovascular: Negative for chest pain, palpitations and leg swelling.  Gastrointestinal: Positive for constipation (occasionally). Negative for diarrhea, nausea and vomiting.  Genitourinary: Negative for frequency and urgency.  Musculoskeletal: Negative for back pain, falls and myalgias.  Skin: Negative for rash.  Neurological: Negative for dizziness, weakness and headaches.  Psychiatric/Behavioral: Negative for depression. The patient is not nervous/anxious.      Pertinent History Reviewed:   Reviewed: Significant for   Medical         Past Medical History:  Diagnosis Date  . ADD (attention deficit disorder)   . Angio-edema   . Anxiety   . Chlamydia 01/27/2019   Treated with azithromycin 500 mg 2 po now,  01/27/2019-----------POC_________  . Cystocele with uterine descensus    uterine descensus per OB/GYN history- no mention of cystocele, only uterine descensus  . Depression   . Gonorrhea 01/27/2019  . Insomnia   . Substance abuse ([place])   . Urticaria                               Surgical Hx:    Past Surgical History:  Procedure Laterality Date  . WISDOM TOOTH EXTRACTION     Medications: Reviewed & Updated - see associated section                       Current Outpatient Medications:  .  ALPRAZolam (XANAX) 0.5 MG tablet, 1 mg 2 (two) times daily as needed for anxiety. , Disp: , Rfl:  .  buprenorphine (SUBUTEX) 8 MG SUBL SL tablet, Place 8 mg under the tongue 3 (three) times daily. , Disp: , Rfl:  .  estradiol (ESTRACE) 0.5 MG tablet, Take 1 tablet (0.5 mg total) by mouth 2 (two) times daily. On days of spotting, Disp: 30 tablet, Rfl: 1 .  gabapentin (NEURONTIN) 300 MG capsule, Take 300 mg by mouth 3 (three) times daily., Disp: , Rfl:  .  Lansoprazole (PREVACID PO), Take by mouth as needed., Disp: , Rfl:  .  linaclotide (LINZESS) 290 MCG CAPS capsule, Take 1 capsule (290 mcg total) by mouth daily before breakfast.,  Disp: 30 capsule, Rfl: 6 .  lisdexamfetamine (VYVANSE) 60 MG capsule, Take 60 mg by mouth every morning., Disp: , Rfl:  .  QUEtiapine (SEROQUEL) 300 MG tablet, Take 300 mg by mouth at bedtime., Disp: , Rfl:    Social History: Reviewed -  reports that she has been smoking cigarettes. She has a 5.00 pack-year smoking history. She has never used smokeless tobacco.  Objective Findings:  Vitals: There were no vitals taken for this visit. BP (!) 95/51 (BP Location: Right Arm, Patient Position: Sitting, Cuff Size: Normal)   Pulse 81   Ht 5\' 11"  (1.803 m)   Wt 179 lb 12.8 oz (81.6 kg)   BMI 25.08 kg/m    PHYSICAL EXAMINATION General appearance - alert, well appearing, and in no distress, oriented to person, place, and time, normal appearing weight and well hydrated Mental  status - alert, oriented to person, place, and time, normal mood, behavior, speech, dress, motor activity, and thought processes, affect appropriate to mood Chest - not examined Heart - not examined Abdomen - not examined Breasts - not examined Skin - normal coloration and turgor, no rashes, no suspicious skin lesions noted  PELVIC External genitalia -relaxed introitus ` Vulva -suspected rectocele no vulvar lesions Vagina -posterior laxity see rectal exam Cervix - multiparous cervix within 4-5 cm of the introitus 3 cm to 4 cm transverse diameter Uterus -retroverted estimate 120 g normal sensitivity Adnexa -fecal contents palpable no masses [place] -not done.  Pap collected last Pap 2 years ago Rectal - rectal exam performed; chronic constipation with rectocele present to greater than 90 degrees on digital rectal exam    Assessment & Plan:   A:  1. Discussion of risks/benefits of hysterectomy as noted below: Discussed supracervical vs total abdominal hysterectomy. Discussed benefits of forgoing cervix removal, including vaginal lubrication, and risks of forgoing cervix removal, including cervical cancer. Discussed reduced risk of ovarian cancer with oophorectomy from 1 in 100 to 1 in 300. Discussed benefits of salpingectomy with oophorectomy, including further reduced risk of cancer, and without oophorectomy, including avoiding hormone therapy. Given medical explainer review of hysterectomy.   At end of discussion, pt had opportunity to ask questions and has no further questions at this time.   Specific discussion of hysterectomy as noted above. Greater than 50% was spent in counseling and coordination of care with the patient.   Total time greater than: 20 minutes.    2. Salpingectomy Discussion: Discussed with pt risks and benefits of BTL. Discussed using clips only vs bilateral salpingectomy. Advised pt that bilateral salpingectomy is a permanent procedure, but reduces the risk of  cancer by 2/3.  It also increases risk of pelvic adhesions at surgery.  At end of discussion, pt had opportunity to ask questions and has no further questions at this time.  The patient opts to have ovarian preservation as well as tubal preservation to avoid adhesions   specific discussion of permanent sterilization as noted above. Greater than 50% was spent in counseling and coordination of care with the patient.   Total time greater than:   minutes.     P:  1.  Vaginal hysterectomy with posterior repair to be scheduled    By signing my name below, I, , attest that this documentation has been prepared under the direction and in the presence of [employer], MD. Electronically Signed: Tilda Burrow Medical Scribe. 11/23/19. 1:38 PM.  I personally performed the services described in this documentation, which was SCRIBED  in my presence. The recorded information has been reviewed and considered accurate. It has been edited as necessary during review. Jonnie Kind, MD

## 2019-11-25 LAB — CYTOLOGY - PAP
Comment: NEGATIVE
Diagnosis: NEGATIVE
High risk HPV: NEGATIVE

## 2019-11-28 DIAGNOSIS — F909 Attention-deficit hyperactivity disorder, unspecified type: Secondary | ICD-10-CM | POA: Diagnosis not present

## 2019-12-02 DIAGNOSIS — F1124 Opioid dependence with opioid-induced mood disorder: Secondary | ICD-10-CM | POA: Diagnosis not present

## 2019-12-02 DIAGNOSIS — F900 Attention-deficit hyperactivity disorder, predominantly inattentive type: Secondary | ICD-10-CM | POA: Diagnosis not present

## 2019-12-02 DIAGNOSIS — F411 Generalized anxiety disorder: Secondary | ICD-10-CM | POA: Diagnosis not present

## 2019-12-09 DIAGNOSIS — F1124 Opioid dependence with opioid-induced mood disorder: Secondary | ICD-10-CM | POA: Diagnosis not present

## 2019-12-09 DIAGNOSIS — F411 Generalized anxiety disorder: Secondary | ICD-10-CM | POA: Diagnosis not present

## 2019-12-09 DIAGNOSIS — F900 Attention-deficit hyperactivity disorder, predominantly inattentive type: Secondary | ICD-10-CM | POA: Diagnosis not present

## 2019-12-12 ENCOUNTER — Telehealth: Payer: Self-pay | Admitting: Obstetrics and Gynecology

## 2019-12-12 NOTE — Telephone Encounter (Signed)
error 

## 2019-12-13 DIAGNOSIS — Z0000 Encounter for general adult medical examination without abnormal findings: Secondary | ICD-10-CM | POA: Diagnosis not present

## 2019-12-13 DIAGNOSIS — Z79899 Other long term (current) drug therapy: Secondary | ICD-10-CM | POA: Diagnosis not present

## 2019-12-16 DIAGNOSIS — F900 Attention-deficit hyperactivity disorder, predominantly inattentive type: Secondary | ICD-10-CM | POA: Diagnosis not present

## 2019-12-16 DIAGNOSIS — F1124 Opioid dependence with opioid-induced mood disorder: Secondary | ICD-10-CM | POA: Diagnosis not present

## 2019-12-16 DIAGNOSIS — F411 Generalized anxiety disorder: Secondary | ICD-10-CM | POA: Diagnosis not present

## 2019-12-27 DIAGNOSIS — R300 Dysuria: Secondary | ICD-10-CM | POA: Diagnosis not present

## 2019-12-27 DIAGNOSIS — A749 Chlamydial infection, unspecified: Secondary | ICD-10-CM | POA: Diagnosis not present

## 2019-12-30 DIAGNOSIS — F1124 Opioid dependence with opioid-induced mood disorder: Secondary | ICD-10-CM | POA: Diagnosis not present

## 2019-12-30 DIAGNOSIS — F900 Attention-deficit hyperactivity disorder, predominantly inattentive type: Secondary | ICD-10-CM | POA: Diagnosis not present

## 2019-12-30 DIAGNOSIS — F411 Generalized anxiety disorder: Secondary | ICD-10-CM | POA: Diagnosis not present

## 2020-01-06 DIAGNOSIS — F1124 Opioid dependence with opioid-induced mood disorder: Secondary | ICD-10-CM | POA: Diagnosis not present

## 2020-01-06 DIAGNOSIS — F900 Attention-deficit hyperactivity disorder, predominantly inattentive type: Secondary | ICD-10-CM | POA: Diagnosis not present

## 2020-01-06 DIAGNOSIS — F411 Generalized anxiety disorder: Secondary | ICD-10-CM | POA: Diagnosis not present

## 2020-01-13 DIAGNOSIS — F900 Attention-deficit hyperactivity disorder, predominantly inattentive type: Secondary | ICD-10-CM | POA: Diagnosis not present

## 2020-01-13 DIAGNOSIS — F1124 Opioid dependence with opioid-induced mood disorder: Secondary | ICD-10-CM | POA: Diagnosis not present

## 2020-01-13 DIAGNOSIS — F411 Generalized anxiety disorder: Secondary | ICD-10-CM | POA: Diagnosis not present

## 2020-01-20 DIAGNOSIS — F1124 Opioid dependence with opioid-induced mood disorder: Secondary | ICD-10-CM | POA: Diagnosis not present

## 2020-01-20 DIAGNOSIS — F411 Generalized anxiety disorder: Secondary | ICD-10-CM | POA: Diagnosis not present

## 2020-01-20 DIAGNOSIS — F900 Attention-deficit hyperactivity disorder, predominantly inattentive type: Secondary | ICD-10-CM | POA: Diagnosis not present

## 2020-01-23 NOTE — Patient Instructions (Signed)
Alexis [name]  01/23/2020     @   Your procedure is scheduled on  01/31/2020 .  Report to [facility] at  [phone]  A.M.  Call this number if you have problems the morning of surgery:  [phone]   Remember:  Do not eat or drink after midnight.                         Take these medicines the morning of surgery with A SIP OF WATER  Xanax(if needed), qadderall, subutex, gabapentin, prevacid.    Do not wear jewelry, make-up or nail polish.  Do not wear lotions, powders, or perfumes. Please wear deodorant and brush your teeth.  Do not shave 48 hours prior to surgery.  Men may shave face and neck.  Do not bring valuables to the hospital.  [facility] is not responsible for any belongings or valuables.  Contacts, dentures or bridgework may not be worn into surgery.  Leave your suitcase in the car.  After surgery it may be brought to your room.  For patients admitted to the hospital, discharge time will be determined by your treatment team.  Patients discharged the day of surgery will not be allowed to drive home.   Name and phone number of your driver:   family Special instructions:  DO NOT smoke the morning of your procedure.  Please read over the following fact sheets that you were given. Anesthesia Post-op Instructions and Care and Recovery After Surgery       Vaginal Hysterectomy, Care After Refer to this sheet in the next few weeks. These instructions provide you with information about caring for yourself after your procedure. Your health care provider may also give you more specific instructions. Your treatment has been planned according to current medical practices, but problems sometimes occur. Call your health care provider if you have any problems or questions after your procedure. What can I expect after the procedure? After the procedure, it is common to have:  Pain.  Soreness and numbness in your incision  areas.  Vaginal bleeding and discharge.  Constipation.  Temporary problems emptying the bladder.  Feelings of sadness or other emotions. Follow these instructions at home: Medicines  Take over-the-counter and prescription medicines only as told by your health care provider.  If you were prescribed an antibiotic medicine, take it as told by your health care provider. Do not stop taking the antibiotic even if you start to feel better.  Do not drive or operate heavy machinery while taking prescription pain medicine. Activity  Return to your normal activities as told by your health care provider. Ask your health care provider what activities are safe for you.  Get regular exercise as told by your health care provider. You may be told to take short walks every day and go farther each time.  Do not lift anything that is heavier than 10 lb (4.5 kg). General instructions   Do not put anything in your vagina for 6 weeks after your surgery or as told by your health care provider. This includes tampons and douches.  Do not have sex until your health care provider says you can.  Do not take baths, swim, or use a hot tub until your health care provider approves.  Drink enough fluid to keep your urine clear or pale yellow.  Do not drive for 24 hours if you were  given a sedative.  Keep all follow-up visits as told by your health care provider. This is important. Contact a health care provider if:  Your pain medicine is not helping.  You have a fever.  You have redness, swelling, or pain at your incision site.  You have blood, pus, or a bad-smelling discharge from your vagina.  You continue to have difficulty urinating. Get help right away if:  You have severe abdominal or back pain.  You have heavy bleeding from your vagina.  You have chest pain or shortness of breath. This information is not intended to replace advice given to you by your health care provider. Make sure you  discuss any questions you have with your health care provider. Document Revised: 03/13/2016 Document Reviewed: 08/05/2015 Elsevier Patient Education  2020 [employer]  Anterior and Posterior Colporrhaphy, Care After This sheet gives you information about how to care for yourself after your procedure. Your health care provider may also give you more specific instructions. If you have problems or questions, contact your health care provider. What can I expect after the procedure? After the procedure, it is common to have:  Pain in the surgical area.  Vaginal spotting and discharge. You will need to use a sanitary pad during this time.  Fatigue. Follow these instructions at home:  Incision care   Follow instructions from your health care provider about how to take care of your incision. Make sure you: ? Wash your hands with soap and water before touching the incision area. If soap and water are not available, use hand sanitizer. ? Clean your incision as told by your health care provider. ? Leave stitches (sutures), skin glue, or adhesive strips in place. These skin closures may need to stay in place for 2 weeks or longer. If adhesive strip edges start to loosen and curl up, you may trim the loose edges. Do not remove adhesive strips completely unless your health care provider tells you to do that.  Check your incision area every day for signs of infection. Check for: ? Redness, swelling, or pain. ? Fluid or blood. ? Warmth. ? Pus or a bad smell.  Check your incision every day to make sure the incision area is not separating or opening up.  Do not take baths, swim, or use a hot tub until your health care provider approves. You may shower.  Keep the area between your vagina and rectum (perineal area) clean and dry. Make sure you clean the area after every bowel movement and each time you urinate.  Ask your health care provider if you can take a sitz bath or sit in a tub of clean,  warm water. Activity  Take frequent, short walks followed by rest periods throughout the day.  Avoid activities that take a lot of effort (are strenuous).  Do not lift anything that is heavier than 10 lb (4.5 kg), or the limit that your health care provider tells you, until he or she says that it is safe. Avoid pushing or pulling motions.  Avoid standing for long periods of time.  Do not douche, use tampons, or have sex until your health care provider says it is okay.  Return to your normal activities as told by your health care provider. Ask your health care provider what activities are safe for you.  Do not drive until your health care provider approves. To prevent constipation To prevent or treat constipation while you are taking prescription pain medicine, your health care provider  may recommend that you:  Take over-the-counter or prescription medicines.  Eat foods that are high in fiber, such as fresh fruits and vegetables, whole grains, and beans.  Drink enough fluid to keep your urine clear or pale yellow.  Limit foods that are high in fat and processed sugars, such as fried and sweet foods. General instructions  Take over-the-counter and prescription medicines only as told by your health care provider.  You may be instructed to do pelvic floor exercises (kegels) as told by your health care provider.  Do not drive or use heavy machinery while taking prescription pain medicine.  Wear compression stockings as told by your health care provider. These stockings help to prevent blood clots and reduce swelling in your legs.  Keep all follow-up visits as told by your health care provider. This is important. Contact a health care provider if:  Medicine does not help your pain.  You have frequent or urgent urination, or you are unable to completely empty your bladder.  You feel a burning sensation when urinating.  You have pus or a bad smell coming from your vaginal  area.  You have redness, swelling, or increasing pain in the vaginal area. Get help right away if:  You have increased bleeding from the vaginal area.  You cannot urinate.  You have a fever or chills.  Your incision separates or opens.  You have trouble breathing. Summary  After the procedure, it is common to have pain, fatigue, spotting, and discharge from the vagina.  Keep the area between your vagina and rectum (perineal area) clean and dry. Make sure you clean the area after every bowel movement and each time you urinate.  Follow instructions from your health care provider about any activity restrictions after the procedure. This information is not intended to replace advice given to you by your health care provider. Make sure you discuss any questions you have with your health care provider. Document Revised: 07/03/2017 Document Reviewed: 08/28/2016 Elsevier Patient Education  2020 [employer].  Salpingectomy, Care After This sheet gives you information about how to care for yourself after your procedure. Your health care provider may also give you more specific instructions. If you have problems or questions, contact your health care provider. What can I expect after the procedure? After the procedure, it is common to have:  Pain in your abdomen.  Some light vaginal bleeding (spotting) for a few days.  Tiredness. Your recovery time will vary depending on which method your surgeon used for your surgery. Follow these instructions at home: Incision care   Follow instructions from your health care provider about how to take care of your incisions. Make sure you: ? Wash your hands with soap and water before and after you change your bandage (dressing). If soap and water are not available, use hand sanitizer. ? Change or remove your dressing as told by your health care provider. ? Leave any stitches (sutures), skin glue, or adhesive strips in place. These skin closures  may need to stay in place for 2 weeks or longer. If adhesive strip edges start to loosen and curl up, you may trim the loose edges. Do not remove adhesive strips completely unless your health care provider tells you to do that.  Keep your dressing clean and dry.  Check your incision area every day for signs of infection. Check for: ? Redness, swelling, or pain that gets worse. ? Fluid or blood. ? Warmth. ? Pus or a bad smell. Activity  Rest as told by your health care provider.  Avoid sitting for a long time without moving. Get up to take short walks every 1-2 hours. This is important to improve blood flow and breathing. Ask for help if you feel weak or unsteady.  Return to your normal activities as told by your health care provider. Ask your health care provider what activities are safe for you.  Do not drive until your health care provider says that it is safe.  Do not lift anything that is heavier than 10 lb (4.5 kg), or the limit that you are told, until your health care provider says that it is safe. This may be 2-6 weeks depending on your surgery.  Until your health care provider approves: ? Do not douche. ? Do not use tampons. ? Do not have sex. Medicines  Take over-the-counter and prescription medicines only as told by your health care provider.  Ask your health care provider if the medicine prescribed to you: ? Requires you to avoid driving or using heavy machinery. ? Can cause constipation. You may need to take actions to prevent or treat constipation, such as:  Drink enough fluid to keep your urine pale yellow.  Take over-the-counter or prescription medicines.  Eat foods that are high in fiber, such as beans, whole grains, and fresh fruits and vegetables.  Limit foods that are high in fat and processed sugars, such as fried or sweet foods. General instructions  Wear compression stockings as told by your health care provider. These stockings help to prevent blood  clots and reduce swelling in your legs.  Do not use any products that contain nicotine or tobacco, such as cigarettes, e-cigarettes, and chewing tobacco. If you need help quitting, ask your health care provider.  Do not take baths, swim, or use a hot tub until your health care provider approves. You may take showers.  Keep all follow-up visits as told by your health care provider. This is important. Contact a health care provider if you have:  Pain when you urinate.  Redness, swelling, or pain around an incision.  Fluid or blood coming from an incision.  Pus or a bad smell coming from an incision.  An incision that feels warm to the touch.  A fever.  Abdominal pain that gets worse or does not get better with medicine.  An incision that starts to break open.  A rash.  Light-headedness.  Nausea and vomiting. Get help right away if you:  Have pain in your chest or leg.  Develop shortness of breath.  Faint.  Have increased or heavy vaginal bleeding, such as soaking a pad in an hour. Summary  After the procedure, it is common to feel tired, have some pain in your abdomen, and have some light vaginal bleeding for a few days.  Follow instructions from your health care provider about how to take care of your incisions.  Return to your normal activities as told by your health care provider. Ask your health care provider what activities are safe for you.  Do not douche, use tampons, or have sex until your health care provider approves.  Keep all follow-up visits as told by your health care provider. This information is not intended to replace advice given to you by your health care provider. Make sure you discuss any questions you have with your health care provider. Document Revised: 07/12/2018 Document Reviewed: 07/12/2018 Elsevier Patient Education  2020 Elsevier Inc.  General Anesthesia, Adult, Care After This sheet gives  you information about how to care for  yourself after your procedure. Your health care provider may also give you more specific instructions. If you have problems or questions, contact your health care provider. What can I expect after the procedure? After the procedure, the following side effects are common:  Pain or discomfort at the IV site.  Nausea.  Vomiting.  Sore throat.  Trouble concentrating.  Feeling cold or chills.  Weak or tired.  Sleepiness and fatigue.  Soreness and body aches. These side effects can affect parts of the body that were not involved in surgery. Follow these instructions at home:  For at least 24 hours after the procedure:  Have a responsible adult stay with you. It is important to have someone help care for you until you are awake and alert.  Rest as needed.  Do not: ? Participate in activities in which you could fall or become injured. ? Drive. ? Use heavy machinery. ? Drink alcohol. ? Take sleeping pills or medicines that cause drowsiness. ? Make important decisions or sign legal documents. ? Take care of children on your own. Eating and drinking  Follow any instructions from your health care provider about eating or drinking restrictions.  When you feel hungry, start by eating small amounts of foods that are soft and easy to digest (bland), such as toast. Gradually return to your regular diet.  Drink enough fluid to keep your urine pale yellow.  If you vomit, rehydrate by drinking water, juice, or clear broth. General instructions  If you have sleep apnea, surgery and certain medicines can increase your risk for breathing problems. Follow instructions from your health care provider about wearing your sleep device: ? Anytime you are sleeping, including during daytime naps. ? While taking prescription pain medicines, sleeping medicines, or medicines that make you drowsy.  Return to your normal activities as told by your health care provider. Ask your health care provider  what activities are safe for you.  Take over-the-counter and prescription medicines only as told by your health care provider.  If you smoke, do not smoke without supervision.  Keep all follow-up visits as told by your health care provider. This is important. Contact a health care provider if:  You have nausea or vomiting that does not get better with medicine.  You cannot eat or drink without vomiting.  You have pain that does not get better with medicine.  You are unable to pass urine.  You develop a skin rash.  You have a fever.  You have redness around your IV site that gets worse. Get help right away if:  You have difficulty breathing.  You have chest pain.  You have blood in your urine or stool, or you vomit blood. Summary  After the procedure, it is common to have a sore throat or nausea. It is also common to feel tired.  Have a responsible adult stay with you for the first 24 hours after general anesthesia. It is important to have someone help care for you until you are awake and alert.  When you feel hungry, start by eating small amounts of foods that are soft and easy to digest (bland), such as toast. Gradually return to your regular diet.  Drink enough fluid to keep your urine pale yellow.  Return to your normal activities as told by your health care provider. Ask your health care provider what activities are safe for you. This information is not intended to replace advice given to you  by your health care provider. Make sure you discuss any questions you have with your health care provider. Document Revised: 07/24/2017 Document Reviewed: 03/06/2017 Elsevier Patient Education  2020 [employer]. How to Use Chlorhexidine for Bathing Chlorhexidine gluconate (CHG) is a germ-killing (antiseptic) solution that is used to clean the skin. It can get rid of the bacteria that normally live on the skin and can keep them away for about 24 hours. To clean your skin with  CHG, you may be given:  A CHG solution to use in the shower or as part of a sponge bath.  A prepackaged cloth that contains CHG. Cleaning your skin with CHG may help lower the risk for infection:  While you are staying in the intensive care unit of the hospital.  If you have a vascular access, such as a central line, to provide short-term or long-term access to your veins.  If you have a catheter to drain urine from your bladder.  If you are on a ventilator. A ventilator is a machine that helps you breathe by moving air in and out of your lungs.  After surgery. What are the risks? Risks of using CHG include:  A skin reaction.  Hearing loss, if CHG gets in your ears.  Eye injury, if CHG gets in your eyes and is not rinsed out.  The CHG product catching fire. Make sure that you avoid smoking and flames after applying CHG to your skin. Do not use CHG:  If you have a chlorhexidine allergy or have previously reacted to chlorhexidine.  On babies younger than 27 months of age. How to use CHG solution  Use CHG only as told by your health care provider, and follow the instructions on the label.  Use the full amount of CHG as directed. Usually, this is one bottle. During a shower Follow these steps when using CHG solution during a shower (unless your health care provider gives you different instructions): 1. Start the shower. 2. Use your normal soap and shampoo to wash your face and hair. 3. Turn off the shower or move out of the shower stream. 4. Pour the CHG onto a clean washcloth. Do not use any type of brush or rough-edged sponge. 5. Starting at your neck, lather your body down to your toes. Make sure you follow these instructions: ? If you will be having surgery, pay special attention to the part of your body where you will be having surgery. Scrub this area for at least 1 minute. ? Do not use CHG on your head or face. If the solution gets into your ears or eyes, rinse them  well with water. ? Avoid your genital area. ? Avoid any areas of skin that have broken skin, cuts, or scrapes. ? Scrub your back and under your arms. Make sure to wash skin folds. 6. Let the lather sit on your skin for 1-2 minutes or as long as told by your health care provider. 7. Thoroughly rinse your entire body in the shower. Make sure that all body creases and crevices are rinsed well. 8. Dry off with a clean towel. Do not put any substances on your body afterward--such as powder, lotion, or perfume--unless you are told to do so by your health care provider. Only use lotions that are recommended by the manufacturer. 9. Put on clean clothes or pajamas. 10. If it is the night before your surgery, sleep in clean sheets.  During a sponge bath Follow these steps when using CHG  solution during a sponge bath (unless your health care provider gives you different instructions): 1. Use your normal soap and shampoo to wash your face and hair. 2. Pour the CHG onto a clean washcloth. 3. Starting at your neck, lather your body down to your toes. Make sure you follow these instructions: ? If you will be having surgery, pay special attention to the part of your body where you will be having surgery. Scrub this area for at least 1 minute. ? Do not use CHG on your head or face. If the solution gets into your ears or eyes, rinse them well with water. ? Avoid your genital area. ? Avoid any areas of skin that have broken skin, cuts, or scrapes. ? Scrub your back and under your arms. Make sure to wash skin folds. 4. Let the lather sit on your skin for 1-2 minutes or as long as told by your health care provider. 5. Using a different clean, wet washcloth, thoroughly rinse your entire body. Make sure that all body creases and crevices are rinsed well. 6. Dry off with a clean towel. Do not put any substances on your body afterward--such as powder, lotion, or perfume--unless you are told to do so by your health care  provider. Only use lotions that are recommended by the manufacturer. 7. Put on clean clothes or pajamas. 8. If it is the night before your surgery, sleep in clean sheets. How to use CHG prepackaged cloths  Only use CHG cloths as told by your health care provider, and follow the instructions on the label.  Use the CHG cloth on clean, dry skin.  Do not use the CHG cloth on your head or face unless your health care provider tells you to.  When washing with the CHG cloth: ? Avoid your genital area. ? Avoid any areas of skin that have broken skin, cuts, or scrapes. Before surgery Follow these steps when using a CHG cloth to clean before surgery (unless your health care provider gives you different instructions): 1. Using the CHG cloth, vigorously scrub the part of your body where you will be having surgery. Scrub using a back-and-forth motion for 3 minutes. The area on your body should be completely wet with CHG when you are done scrubbing. 2. Do not rinse. Discard the cloth and let the area air-dry. Do not put any substances on the area afterward, such as powder, lotion, or perfume. 3. Put on clean clothes or pajamas. 4. If it is the night before your surgery, sleep in clean sheets.  For general bathing Follow these steps when using CHG cloths for general bathing (unless your health care provider gives you different instructions). 1. Use a separate CHG cloth for each area of your body. Make sure you wash between any folds of skin and between your fingers and toes. Wash your body in the following order, switching to a new cloth after each step: ? The front of your neck, shoulders, and chest. ? Both of your arms, under your arms, and your hands. ? Your stomach and groin area, avoiding the genitals. ? Your right leg and foot. ? Your left leg and foot. ? The back of your neck, your back, and your buttocks. 2. Do not rinse. Discard the cloth and let the area air-dry. Do not put any substances on  your body afterward--such as powder, lotion, or perfume--unless you are told to do so by your health care provider. Only use lotions that are recommended by the manufacturer.  3. Put on clean clothes or pajamas. Contact a health care provider if:  Your skin gets irritated after scrubbing.  You have questions about using your solution or cloth. Get help right away if:  Your eyes become very red or swollen.  Your eyes itch badly.  Your skin itches badly and is red or swollen.  Your hearing changes.  You have trouble seeing.  You have swelling or tingling in your mouth or throat.  You have trouble breathing.  You swallow any chlorhexidine. Summary  Chlorhexidine gluconate (CHG) is a germ-killing (antiseptic) solution that is used to clean the skin. Cleaning your skin with CHG may help to lower your risk for infection.  You may be given CHG to use for bathing. It may be in a bottle or in a prepackaged cloth to use on your skin. Carefully follow your health care provider's instructions and the instructions on the product label.  Do not use CHG if you have a chlorhexidine allergy.  Contact your health care provider if your skin gets irritated after scrubbing. This information is not intended to replace advice given to you by your health care provider. Make sure you discuss any questions you have with your health care provider. Document Revised: 10/07/2018 Document Reviewed: 06/18/2017 Elsevier Patient Education  [place].

## 2020-01-27 ENCOUNTER — Other Ambulatory Visit: Payer: Self-pay

## 2020-01-27 ENCOUNTER — Other Ambulatory Visit: Payer: Self-pay | Admitting: Obstetrics and Gynecology

## 2020-01-27 ENCOUNTER — Other Ambulatory Visit (HOSPITAL_COMMUNITY)
Admission: RE | Admit: 2020-01-27 | Discharge: 2020-01-27 | Disposition: A | Discharge status: Home / Self Care | Payer: Medicaid Other | Source: Ambulatory Visit | Attending: Obstetrics and Gynecology | Admitting: Obstetrics and Gynecology

## 2020-01-27 ENCOUNTER — Encounter (HOSPITAL_COMMUNITY)
Admission: RE | Admit: 2020-01-27 | Discharge: 2020-01-27 | Disposition: A | Discharge status: Home / Self Care | Payer: Medicaid Other | Source: Ambulatory Visit | Attending: Obstetrics and Gynecology | Admitting: Obstetrics and Gynecology

## 2020-01-27 ENCOUNTER — Encounter (HOSPITAL_COMMUNITY): Payer: Self-pay

## 2020-01-27 DIAGNOSIS — Z20822 Contact with and (suspected) exposure to covid-19: Secondary | ICD-10-CM | POA: Diagnosis not present

## 2020-01-27 DIAGNOSIS — F900 Attention-deficit hyperactivity disorder, predominantly inattentive type: Secondary | ICD-10-CM | POA: Diagnosis not present

## 2020-01-27 DIAGNOSIS — F1124 Opioid dependence with opioid-induced mood disorder: Secondary | ICD-10-CM | POA: Diagnosis not present

## 2020-01-27 DIAGNOSIS — F411 Generalized anxiety disorder: Secondary | ICD-10-CM | POA: Diagnosis not present

## 2020-01-27 DIAGNOSIS — Z01812 Encounter for preprocedural laboratory examination: Secondary | ICD-10-CM | POA: Insufficient documentation

## 2020-01-27 LAB — COMPREHENSIVE METABOLIC PANEL
ALT: 16 U/L (ref 0–44)
AST: 23 U/L (ref 15–41)
Albumin: 4.2 g/dL (ref 3.5–5.0)
Alkaline Phosphatase: 54 U/L (ref 38–126)
Anion gap: 9 (ref 5–15)
BUN: 9 mg/dL (ref 6–20)
CO2: 28 mmol/L (ref 22–32)
Calcium: 9.3 mg/dL (ref 8.9–10.3)
Chloride: 101 mmol/L (ref 98–111)
Creatinine, Ser: 0.65 mg/dL (ref 0.44–1.00)
GFR calc Af Amer: >60 mL/min (ref >60)
GFR calc non Af Amer: >60 mL/min (ref >60)
Glucose, Bld: 71 mg/dL (ref 70–99)
Potassium: 3.8 mmol/L (ref 3.5–5.1)
Sodium: 138 mmol/L (ref 135–145)
Total Bilirubin: 0.5 mg/dL (ref 0.3–1.2)
Total Protein: 7.1 g/dL (ref 6.5–8.1)

## 2020-01-27 LAB — CBC
HCT: 36.6 % (ref 36.0–46.0)
Hemoglobin: 12.3 g/dL (ref 12.0–15.0)
MCH: 32.5 pg (ref 26.0–34.0)
MCHC: 33.6 g/dL (ref 30.0–36.0)
MCV: 96.8 fL (ref 80.0–100.0)
Platelets: 216 10*3/uL (ref 150–400)
RBC: 3.78 MIL/uL — ABNORMAL LOW (ref 3.87–5.11)
RDW: 12.2 % (ref 11.5–15.5)
WBC: 5.1 10*3/uL (ref 4.0–10.5)
nRBC: 0 % (ref 0.0–0.2)

## 2020-01-27 LAB — HCG, SERUM, QUALITATIVE: Preg, Serum: NEGATIVE

## 2020-01-27 LAB — TYPE AND SCREEN
ABO/RH(D): A NEG
Antibody Screen: NEGATIVE

## 2020-01-28 LAB — SARS CORONAVIRUS 2 (TAT 6-24 HRS): SARS Coronavirus 2: NEGATIVE

## 2020-01-31 ENCOUNTER — Observation Stay (HOSPITAL_COMMUNITY): Payer: Medicaid Other | Admitting: Anesthesiology

## 2020-01-31 ENCOUNTER — Encounter (HOSPITAL_COMMUNITY)
Admission: RE | Disposition: A | Discharge status: Home / Self Care | Payer: Self-pay | Source: Home / Self Care | Attending: Obstetrics and Gynecology

## 2020-01-31 ENCOUNTER — Encounter (HOSPITAL_COMMUNITY): Payer: Self-pay | Admitting: Obstetrics and Gynecology

## 2020-01-31 ENCOUNTER — Other Ambulatory Visit: Payer: Self-pay

## 2020-01-31 ENCOUNTER — Observation Stay (HOSPITAL_COMMUNITY)
Admission: RE | Admit: 2020-01-31 | Discharge: 2020-02-02 | Disposition: A | Discharge status: Home / Self Care | Payer: Medicaid Other | Attending: Obstetrics and Gynecology | Admitting: Obstetrics and Gynecology

## 2020-01-31 DIAGNOSIS — F17210 Nicotine dependence, cigarettes, uncomplicated: Secondary | ICD-10-CM | POA: Insufficient documentation

## 2020-01-31 DIAGNOSIS — Z90710 Acquired absence of both cervix and uterus: Secondary | ICD-10-CM | POA: Diagnosis present

## 2020-01-31 DIAGNOSIS — N854 Malposition of uterus: Secondary | ICD-10-CM | POA: Diagnosis present

## 2020-01-31 DIAGNOSIS — N8189 Other female genital prolapse: Secondary | ICD-10-CM | POA: Insufficient documentation

## 2020-01-31 DIAGNOSIS — F419 Anxiety disorder, unspecified: Secondary | ICD-10-CM | POA: Insufficient documentation

## 2020-01-31 DIAGNOSIS — K5909 Other constipation: Secondary | ICD-10-CM | POA: Insufficient documentation

## 2020-01-31 DIAGNOSIS — Z79899 Other long term (current) drug therapy: Secondary | ICD-10-CM | POA: Insufficient documentation

## 2020-01-31 DIAGNOSIS — N941 Unspecified dyspareunia: Secondary | ICD-10-CM | POA: Diagnosis not present

## 2020-01-31 DIAGNOSIS — N816 Rectocele: Secondary | ICD-10-CM | POA: Diagnosis not present

## 2020-01-31 DIAGNOSIS — G4700 Insomnia, unspecified: Secondary | ICD-10-CM | POA: Insufficient documentation

## 2020-01-31 DIAGNOSIS — F17200 Nicotine dependence, unspecified, uncomplicated: Secondary | ICD-10-CM | POA: Insufficient documentation

## 2020-01-31 DIAGNOSIS — N83201 Unspecified ovarian cyst, right side: Secondary | ICD-10-CM | POA: Diagnosis not present

## 2020-01-31 DIAGNOSIS — F329 Major depressive disorder, single episode, unspecified: Secondary | ICD-10-CM | POA: Diagnosis not present

## 2020-01-31 DIAGNOSIS — N812 Incomplete uterovaginal prolapse: Secondary | ICD-10-CM | POA: Insufficient documentation

## 2020-01-31 DIAGNOSIS — G894 Chronic pain syndrome: Secondary | ICD-10-CM | POA: Diagnosis not present

## 2020-01-31 DIAGNOSIS — F988 Other specified behavioral and emotional disorders with onset usually occurring in childhood and adolescence: Secondary | ICD-10-CM | POA: Insufficient documentation

## 2020-01-31 DIAGNOSIS — N72 Inflammatory disease of cervix uteri: Secondary | ICD-10-CM | POA: Diagnosis not present

## 2020-01-31 HISTORY — PX: VAGINAL HYSTERECTOMY: SHX2639

## 2020-01-31 HISTORY — PX: RECTOCELE REPAIR: SHX761

## 2020-01-31 LAB — GLUCOSE, CAPILLARY: Glucose-Capillary: 113 mg/dL — ABNORMAL HIGH (ref 70–99)

## 2020-01-31 SURGERY — HYSTERECTOMY, VAGINAL
Anesthesia: General

## 2020-01-31 MED ORDER — ORAL CARE MOUTH RINSE: 15.0000 mL | Freq: Once | OROMUCOSAL | Status: AC

## 2020-01-31 MED ORDER — ONDANSETRON HCL 4 MG/2ML IJ SOLN: 4.0000 mg | Freq: Four times a day (QID) | INTRAMUSCULAR | Status: DC | PRN

## 2020-01-31 MED ORDER — CEFAZOLIN SODIUM-DEXTROSE 2-4 GM/100ML-% IV SOLN
INTRAVENOUS | Status: AC
  Filled 2020-01-31: qty 100

## 2020-01-31 MED ORDER — BUPRENORPHINE HCL 2 MG SL SUBL
8.0000 mg | SUBLINGUAL_TABLET | Freq: Every day | SUBLINGUAL | Status: DC
  Administered 2020-01-31 – 2020-02-02 (×3): 8 mg via SUBLINGUAL
  Filled 2020-01-31 (×3): qty 4

## 2020-01-31 MED ORDER — ENOXAPARIN SODIUM 40 MG/0.4ML ~~LOC~~ SOLN
40.0000 mg | SUBCUTANEOUS | Status: DC
  Administered 2020-02-01 – 2020-02-02 (×2): 40 mg via SUBCUTANEOUS
  Filled 2020-01-31 (×2): qty 0.4

## 2020-01-31 MED ORDER — GABAPENTIN 300 MG PO CAPS
ORAL_CAPSULE | ORAL | Status: AC
  Filled 2020-01-31: qty 1

## 2020-01-31 MED ORDER — GLYCOPYRROLATE 0.2 MG/ML IJ SOLN
INTRAMUSCULAR | Status: DC | PRN
  Administered 2020-01-31: .2 mg via INTRAVENOUS

## 2020-01-31 MED ORDER — FENTANYL CITRATE (PF) 250 MCG/5ML IJ SOLN
INTRAMUSCULAR | Status: AC
  Filled 2020-01-31: qty 5

## 2020-01-31 MED ORDER — DIPHENHYDRAMINE HCL 50 MG/ML IJ SOLN: 12.5000 mg | Freq: Four times a day (QID) | INTRAMUSCULAR | Status: DC | PRN

## 2020-01-31 MED ORDER — POVIDONE-IODINE 10 % EX SWAB: 2.0000 "application " | Freq: Once | CUTANEOUS | Status: DC

## 2020-01-31 MED ORDER — LORAZEPAM 1 MG PO TABS
1.0000 mg | ORAL_TABLET | Freq: Four times a day (QID) | ORAL | Status: DC | PRN
  Administered 2020-01-31 – 2020-02-02 (×2): 1 mg via ORAL
  Filled 2020-01-31 (×3): qty 1

## 2020-01-31 MED ORDER — MIDAZOLAM HCL 2 MG/2ML IJ SOLN
INTRAMUSCULAR | Status: AC
  Filled 2020-01-31: qty 2

## 2020-01-31 MED ORDER — HYDROMORPHONE HCL 1 MG/ML IJ SOLN
0.5000 mg | INTRAMUSCULAR | Status: DC | PRN
  Administered 2020-02-01 (×2): 0.5 mg via INTRAVENOUS
  Filled 2020-01-31 (×2): qty 0.5

## 2020-01-31 MED ORDER — CHLORHEXIDINE GLUCONATE 0.12 % MT SOLN
OROMUCOSAL | Status: AC
  Filled 2020-01-31: qty 15

## 2020-01-31 MED ORDER — DEXAMETHASONE SODIUM PHOSPHATE 10 MG/ML IJ SOLN
INTRAMUSCULAR | Status: AC
  Filled 2020-01-31: qty 1

## 2020-01-31 MED ORDER — KETOROLAC TROMETHAMINE 15 MG/ML IJ SOLN
15.0000 mg | Freq: Four times a day (QID) | INTRAMUSCULAR | Status: AC
  Administered 2020-01-31 – 2020-02-01 (×5): 15 mg via INTRAVENOUS
  Filled 2020-01-31 (×5): qty 1

## 2020-01-31 MED ORDER — HYDROMORPHONE 1 MG/ML IV SOLN: INTRAVENOUS | Status: DC

## 2020-01-31 MED ORDER — HYDROMORPHONE HCL 1 MG/ML IJ SOLN
INTRAMUSCULAR | Status: AC
  Filled 2020-01-31: qty 1

## 2020-01-31 MED ORDER — SUGAMMADEX SODIUM 200 MG/2ML IV SOLN
INTRAVENOUS | Status: DC | PRN
  Administered 2020-01-31: 150 mg via INTRAVENOUS

## 2020-01-31 MED ORDER — PROPOFOL 10 MG/ML IV BOLUS
INTRAVENOUS | Status: DC | PRN
  Administered 2020-01-31: 200 mg via INTRAVENOUS
  Administered 2020-01-31: 50 mg via INTRAVENOUS

## 2020-01-31 MED ORDER — DIPHENHYDRAMINE HCL 12.5 MG/5ML PO ELIX: 12.5000 mg | ORAL_SOLUTION | Freq: Four times a day (QID) | ORAL | Status: DC | PRN

## 2020-01-31 MED ORDER — KETOROLAC TROMETHAMINE 15 MG/ML IJ SOLN: 15.0000 mg | Freq: Four times a day (QID) | INTRAMUSCULAR | Status: DC

## 2020-01-31 MED ORDER — PROPOFOL 10 MG/ML IV BOLUS
INTRAVENOUS | Status: AC
  Filled 2020-01-31: qty 40

## 2020-01-31 MED ORDER — HYDROMORPHONE HCL 1 MG/ML IJ SOLN
INTRAMUSCULAR | Status: AC
  Filled 2020-01-31: qty 0.5

## 2020-01-31 MED ORDER — STERILE WATER FOR IRRIGATION IR SOLN
Status: DC | PRN
  Administered 2020-01-31: 1000 mL

## 2020-01-31 MED ORDER — PHENYLEPHRINE HCL (PRESSORS) 10 MG/ML IV SOLN
INTRAVENOUS | Status: DC | PRN
  Administered 2020-01-31: 80 ug via INTRAVENOUS

## 2020-01-31 MED ORDER — GABAPENTIN 300 MG PO CAPS: 300.0000 mg | ORAL_CAPSULE | ORAL | Status: DC

## 2020-01-31 MED ORDER — SODIUM CHLORIDE 0.9 % IV SOLN: INTRAVENOUS | Status: DC

## 2020-01-31 MED ORDER — HYDROMORPHONE HCL 1 MG/ML IJ SOLN
0.2500 mg | INTRAMUSCULAR | Status: AC | PRN
  Administered 2020-01-31 (×8): 0.5 mg via INTRAVENOUS
  Filled 2020-01-31 (×5): qty 0.5

## 2020-01-31 MED ORDER — PANTOPRAZOLE SODIUM 40 MG PO TBEC
40.0000 mg | DELAYED_RELEASE_TABLET | Freq: Every day | ORAL | Status: DC
  Administered 2020-02-01 – 2020-02-02 (×2): 40 mg via ORAL
  Filled 2020-01-31 (×2): qty 1

## 2020-01-31 MED ORDER — ONDANSETRON HCL 4 MG PO TABS: 4.0000 mg | ORAL_TABLET | Freq: Four times a day (QID) | ORAL | Status: DC | PRN

## 2020-01-31 MED ORDER — NALOXONE HCL 0.4 MG/ML IJ SOLN: 0.4000 mg | INTRAMUSCULAR | Status: DC | PRN

## 2020-01-31 MED ORDER — SODIUM CHLORIDE 0.9 % IR SOLN
Status: DC | PRN
  Administered 2020-01-31: 3000 mL

## 2020-01-31 MED ORDER — SODIUM CHLORIDE 0.9% FLUSH: 9.0000 mL | INTRAVENOUS | Status: DC | PRN

## 2020-01-31 MED ORDER — LIDOCAINE HCL (CARDIAC) PF 100 MG/5ML IV SOSY
PREFILLED_SYRINGE | INTRAVENOUS | Status: DC | PRN
  Administered 2020-01-31: 100 mg via INTRAVENOUS

## 2020-01-31 MED ORDER — ROCURONIUM BROMIDE 100 MG/10ML IV SOLN
INTRAVENOUS | Status: DC | PRN
  Administered 2020-01-31: 10 mg via INTRAVENOUS
  Administered 2020-01-31: 50 mg via INTRAVENOUS

## 2020-01-31 MED ORDER — MIDAZOLAM HCL 2 MG/2ML IJ SOLN
INTRAMUSCULAR | Status: DC | PRN
  Administered 2020-01-31: 2 mg via INTRAVENOUS

## 2020-01-31 MED ORDER — DEXMEDETOMIDINE HCL 200 MCG/2ML IV SOLN
INTRAVENOUS | Status: DC | PRN
  Administered 2020-01-31 (×2): 8 ug via INTRAVENOUS
  Administered 2020-01-31: 4 ug via INTRAVENOUS

## 2020-01-31 MED ORDER — FENTANYL CITRATE (PF) 100 MCG/2ML IJ SOLN
INTRAMUSCULAR | Status: DC | PRN
  Administered 2020-01-31 (×2): 50 ug via INTRAVENOUS

## 2020-01-31 MED ORDER — BUPIVACAINE-EPINEPHRINE (PF) 0.5% -1:200000 IJ SOLN
INTRAMUSCULAR | Status: AC
  Filled 2020-01-31: qty 30

## 2020-01-31 MED ORDER — 0.9 % SODIUM CHLORIDE (POUR BTL) OPTIME
TOPICAL | Status: DC | PRN
  Administered 2020-01-31: 1000 mL

## 2020-01-31 MED ORDER — LACTATED RINGERS IV SOLN: INTRAVENOUS | Status: DC

## 2020-01-31 MED ORDER — GABAPENTIN 100 MG PO CAPS
100.0000 mg | ORAL_CAPSULE | Freq: Three times a day (TID) | ORAL | Status: DC
  Administered 2020-01-31 – 2020-02-02 (×6): 100 mg via ORAL
  Filled 2020-01-31 (×6): qty 1

## 2020-01-31 MED ORDER — GLYCOPYRROLATE PF 0.2 MG/ML IJ SOSY
PREFILLED_SYRINGE | INTRAMUSCULAR | Status: AC
  Filled 2020-01-31: qty 1

## 2020-01-31 MED ORDER — DEXAMETHASONE SODIUM PHOSPHATE 4 MG/ML IJ SOLN
INTRAMUSCULAR | Status: DC | PRN
  Administered 2020-01-31: 10 mg via INTRAVENOUS

## 2020-01-31 MED ORDER — ONDANSETRON HCL 4 MG/2ML IJ SOLN: 4.0000 mg | Freq: Once | INTRAMUSCULAR | Status: DC | PRN

## 2020-01-31 MED ORDER — CEFAZOLIN SODIUM-DEXTROSE 2-4 GM/100ML-% IV SOLN
2.0000 g | INTRAVENOUS | Status: AC
  Administered 2020-01-31: 2 g via INTRAVENOUS

## 2020-01-31 MED ORDER — BUPIVACAINE-EPINEPHRINE 0.5% -1:200000 IJ SOLN
INTRAMUSCULAR | Status: DC | PRN
  Administered 2020-01-31 (×2): 10 mL

## 2020-01-31 MED ORDER — CHLORHEXIDINE GLUCONATE 0.12 % MT SOLN
15.0000 mL | Freq: Once | OROMUCOSAL | Status: AC
  Administered 2020-01-31: 15 mL via OROMUCOSAL

## 2020-01-31 MED ORDER — ONDANSETRON HCL 4 MG/2ML IJ SOLN
INTRAMUSCULAR | Status: DC | PRN
  Administered 2020-01-31: 4 mg via INTRAVENOUS

## 2020-01-31 MED ORDER — ONDANSETRON HCL 4 MG/2ML IJ SOLN
INTRAMUSCULAR | Status: AC
  Filled 2020-01-31: qty 2

## 2020-01-31 MED ORDER — HYDROMORPHONE 1 MG/ML IV SOLN
INTRAVENOUS | Status: AC
  Filled 2020-01-31: qty 30

## 2020-01-31 SURGICAL SUPPLY — 39 items
BLADE SURG SZ10 CARB STEEL (BLADE) ×4 IMPLANT
CLOTH BEACON ORANGE TIMEOUT ST (SAFETY) ×2 IMPLANT
COVER LIGHT HANDLE STERIS (MISCELLANEOUS) ×4 IMPLANT
COVER WAND RF STERILE (DRAPES) ×2 IMPLANT
DECANTER SPIKE VIAL GLASS SM (MISCELLANEOUS) ×2 IMPLANT
DRAPE HALF SHEET 40X57 (DRAPES) ×2 IMPLANT
DRAPE STERI URO 9X17 APER PCH (DRAPES) ×2 IMPLANT
ELECT REM PT RETURN 9FT ADLT (ELECTROSURGICAL) ×2
ELECTRODE REM PT RTRN 9FT ADLT (ELECTROSURGICAL) ×1 IMPLANT
GAUZE 4X4 16PLY RFD (DISPOSABLE) ×2 IMPLANT
GAUZE PACKING 2X5 YD STRL (GAUZE/BANDAGES/DRESSINGS) ×2 IMPLANT
GAUZE SPONGE 4X4 12PLY STRL (GAUZE/BANDAGES/DRESSINGS) ×2 IMPLANT
GLOVE BIOGEL PI IND STRL 7.0 (GLOVE) ×4 IMPLANT
GLOVE BIOGEL PI IND STRL 9 (GLOVE) ×1 IMPLANT
GLOVE BIOGEL PI INDICATOR 7.0 (GLOVE) ×4
GLOVE BIOGEL PI INDICATOR 9 (GLOVE) ×1
GLOVE ECLIPSE 9.0 STRL (GLOVE) ×2 IMPLANT
GOWN SPEC L3 XXLG W/TWL (GOWN DISPOSABLE) ×2 IMPLANT
GOWN STRL REUS W/TWL LRG LVL3 (GOWN DISPOSABLE) ×4 IMPLANT
IV NS IRRIG 3000ML ARTHROMATIC (IV SOLUTION) ×2 IMPLANT
KIT TURNOVER CYSTO (KITS) ×2 IMPLANT
MANIFOLD NEPTUNE II (INSTRUMENTS) ×2 IMPLANT
NEEDLE HYPO 25X1 1.5 SAFETY (NEEDLE) ×2 IMPLANT
NS IRRIG 1000ML POUR BTL (IV SOLUTION) ×2 IMPLANT
PACK PERI GYN (CUSTOM PROCEDURE TRAY) ×2 IMPLANT
PAD ARMBOARD 7.5X6 YLW CONV (MISCELLANEOUS) ×2 IMPLANT
SET BASIN LINEN APH (SET/KITS/TRAYS/PACK) ×2 IMPLANT
SURGILUBE 2OZ TUBE FLIPTOP (MISCELLANEOUS) ×2 IMPLANT
SUT CHROMIC 0 CT 1 (SUTURE) ×24 IMPLANT
SUT CHROMIC 2 0 CT 1 (SUTURE) ×6 IMPLANT
SUT CHROMIC GUT BROWN 0 54 (SUTURE) IMPLANT
SUT CHROMIC GUT BROWN 0 54IN (SUTURE)
SUT PROLENE 2 0 SH 30 (SUTURE) ×2 IMPLANT
SUT VIC AB 0 CT2 8-18 (SUTURE) ×2 IMPLANT
SYR CONTROL 10ML LL (SYRINGE) ×2 IMPLANT
TRAY FOLEY MTR SLVR 16FR STAT (SET/KITS/TRAYS/PACK) ×2 IMPLANT
TRAY FOLEY SLVR 16FR LF STAT (SET/KITS/TRAYS/PACK) ×2 IMPLANT
VERSALIGHT (MISCELLANEOUS) ×2 IMPLANT
WATER STERILE IRR 1000ML POUR (IV SOLUTION) ×2 IMPLANT

## 2020-01-31 NOTE — Anesthesia Preprocedure Evaluation (Signed)
Anesthesia Evaluation  Patient identified by MRN, date of birth, ID band Patient awake    Reviewed: Allergy & Precautions, H&P , NPO status , Patient's Chart, lab work & pertinent test results, reviewed documented beta blocker date and time   Airway Mallampati: II  TM Distance: >3 FB Neck ROM: full    Dental no notable dental hx.    Pulmonary neg pulmonary ROS, Current Smoker,    Pulmonary exam normal breath sounds clear to auscultation       Cardiovascular Exercise Tolerance: Good negative cardio ROS   Rhythm:regular Rate:Normal     Neuro/Psych PSYCHIATRIC DISORDERS Anxiety Depression negative neurological ROS     GI/Hepatic negative GI ROS, Neg liver ROS,   Endo/Other  negative endocrine ROS  Renal/GU negative Renal ROS  negative genitourinary   Musculoskeletal   Abdominal   Peds  Hematology negative hematology ROS (+)   Anesthesia Other Findings   Reproductive/Obstetrics negative OB ROS                             Anesthesia Physical Anesthesia Plan  ASA: III  Anesthesia Plan: General   Post-op Pain Management:    Induction:   PONV Risk Score and Plan: 3 and Ondansetron  Airway Management Planned:   Additional Equipment:   Intra-op Plan:   Post-operative Plan:   Informed Consent: I have reviewed the patients History and Physical, chart, labs and discussed the procedure including the risks, benefits and alternatives for the proposed anesthesia with the patient or authorized representative who has indicated his/her understanding and acceptance.     Dental Advisory Given  Plan Discussed with: CRNA  Anesthesia Plan Comments:         Anesthesia Quick Evaluation

## 2020-01-31 NOTE — Transfer of Care (Signed)
Immediate Anesthesia Transfer of Care Note  Patient: Alexis [name]  Procedure(s) Performed: VAGINAL HYSTERECTOMY WITH REMOVAL OF CERVIX AND UTERUS  (N/A ) POSTERIOR REPAIR (RECTOCELE) (N/A )  Patient Location: PACU  Anesthesia Type:General  Level of Consciousness: awake, alert , oriented and patient cooperative  Airway & Oxygen Therapy: Patient Spontanous Breathing and Patient connected to face mask oxygen  Post-op Assessment: Report given to RN and Post -op Vital signs reviewed and stable  Post vital signs: Reviewed and stable  Last Vitals:  Vitals Value Taken Time  BP    Temp    Pulse    Resp    SpO2      Last Pain:  Vitals:   01/31/20 0801  TempSrc: Oral  PainSc: 9       Patients Stated Pain Goal: 6 (01/31/20 0801)  Complications: No complications documented.

## 2020-01-31 NOTE — Anesthesia Postprocedure Evaluation (Signed)
Anesthesia Post Note  Patient: Alexis [name]  Procedure(s) Performed: VAGINAL HYSTERECTOMY WITH REMOVAL OF CERVIX AND UTERUS  (N/A ) POSTERIOR REPAIR (RECTOCELE) (N/A )  Patient location during evaluation: PACU Anesthesia Type: General Level of consciousness: awake and alert Pain management: pain level controlled Vital Signs Assessment: post-procedure vital signs reviewed and stable Respiratory status: spontaneous breathing, nonlabored ventilation and respiratory function stable Cardiovascular status: stable Postop Assessment: no apparent nausea or vomiting Anesthetic complications: no   No complications documented.   Last Vitals:  Vitals:   01/31/20 1345 01/31/20 1400  BP: 100/65 (!) 72/52  Pulse: 69 82  Resp: (!) 9 12  Temp:    SpO2: 99% 97%    Last Pain:  Vitals:   01/31/20 1345  TempSrc:   PainSc: Asleep                 Terrianne Cavness Hristova

## 2020-01-31 NOTE — Progress Notes (Signed)
Day of Surgery Procedure(s) (LRB): VAGINAL HYSTERECTOMY WITH REMOVAL OF CERVIX AND UTERUS  (N/A) POSTERIOR REPAIR (RECTOCELE) (N/A)  Subjective: Patient reports incisional pain.  Is in the mid lower back similar to dysmenorrhea pain. No blood per vagina, poor pain control: has fibromyalgia, is on Ativan 1 mg tid, plus Dilaudid PCA as well as Subutex (chronic at 8 mg tid)  Dilaudid expected to be less efficacious with the Subutex , will add IV toradol now that postop a few hours.   Objective: I have reviewed patient's vital signs and intake and output. BP 110/65 (BP Location: Right Leg)   Pulse 67   Temp 97.9 F (36.6 C) (Oral)   Resp 11   Ht 5\' 11"  (1.803 m)   Wt 77.1 kg   SpO2 96%   BMI 23.71 kg/m  '  Intake/Output Summary (Last 24 hours) at 01/31/2020 2018 Last data filed at 01/31/2020 2000 Gross per 24 hour  Intake 1939.04 ml  Output 1550 ml  Net 389.04 ml     General: distracted and mild distress GI: normal findings: soft, non-tender Extremities: Homans sign is negative, no sign of DVT Vaginal Bleeding: none Vaginal pack in place Assessment: s/p Procedure(s): VAGINAL HYSTERECTOMY WITH REMOVAL OF CERVIX AND UTERUS  (N/A) POSTERIOR REPAIR (RECTOCELE) (N/A): stable  Plan: add IV toradol q 6 h  LOS: 0 days    02/02/2020 01/31/2020, 8:17 PM
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
## 2020-01-31 NOTE — Anesthesia Procedure Notes (Signed)
Procedure Name: Intubation Date/Time: 01/31/2020 8:36 AM Performed by: Georgeanne Nim, CRNA Pre-anesthesia Checklist: Patient identified, Emergency Drugs available, Suction available, Patient being monitored and Timeout performed Patient Re-evaluated:Patient Re-evaluated prior to induction Oxygen Delivery Method: Circle system utilized Preoxygenation: Pre-oxygenation with 100% oxygen Induction Type: IV induction Ventilation: Mask ventilation without difficulty Laryngoscope Size: Mac and 4 Grade View: Grade I Tube size: 7.0 mm Number of attempts: 1 Airway Equipment and Method: Stylet Placement Confirmation: ETT inserted through vocal cords under direct vision,  positive ETCO2,  CO2 detector and breath sounds checked- equal and bilateral Secured at: 20 cm Tube secured with: Tape Dental Injury: Teeth and Oropharynx as per pre-operative assessment

## 2020-01-31 NOTE — Op Note (Signed)
PRE-OPERATIVE DIAGNOSIS:  Retroverted Uterus Rectocele Dyspareuia Pelvic relaxation  POST-OPERATIVE DIAGNOSIS:  Retroverted Uterus Rectocele  Dyspareuia Pelvic relaxation  PROCEDURE:  Procedure(s): VAGINAL HYSTERECTOMY WITH REMOVAL OF CERVIX AND UTERUS  (N/A) POSTERIOR REPAIR (RECTOCELE) (N/A)  SURGEON:  Surgeon(s) and Role:    Tilda Burrow, MD - Primary  PHYSICIAN ASSISTANT:   ASSISTANTS: Valetta Close, RNFA  ANESTHESIA:   local and general  EBL:  100 mL   BLOOD ADMINISTERED:none  DRAINS: Urinary Catheter (Foley)   LOCAL MEDICATIONS USED:  MARCAINE    and Amount: 20 ml  SPECIMEN:  Source of Specimen:  Uterus and cervix  DISPOSITION OF SPECIMEN:  PATHOLOGY  COUNTS:  YES  TOURNIQUET:  * No tourniquets in log *  DICTATION: .Dragon Dictation  PLAN OF CARE: Admit for overnight observation  PATIENT DISPOSITION:  PACU - hemodynamically stable.   Delay start of Pharmacological VTE agent (>24hrs) due to surgical blood loss or risk of bleeding: not applicable Details of procedure: Patient was taken the operating room, general anesthesia introduced and patient positioned for procedure.  Timeout was conducted.  Procedure confirmed by operative team.  Perineum and lower abdomen were prepped and draped for vaginal procedure with legs in standard candycane support with perineum position 1 cm past the end of the table.  The weighted short speculum was placed in the vagina and a single Lahey thyroid tenaculum attached to the lip of the cervix.  Cervix was circumscribed with Marcaine with epinephrine and then Bovie cautery used to encircle the cervix at the level of the cervical vaginal junction.  Anteriorly sharp dissection identified the avascular potential space beneath the bladder we were able to dissect upward with a combination of blunt and sharp dissection identifying the anterior peritoneum and entering without difficulty.  Posteriorly a colpotomy incision was performed  which allowed entry into the cul-de-sac without difficulty.  The cervix descended to 1 cm outside the introitus with minimal to moderate traction.  First the uterosacral ligament on the patient left was grasped with a curved Zeppelin clamp transected ligated with 0 chromic and tagged the lower cardinal ligaments on the patient left side were then clamped cut and suture-ligated and standard fashion with Zeppelin clamps, Mayo scissors and 0 chromic suture ligature procedure was repeated on the patient right side.  We then marched up the lateral aspects of the uterus on the patient right side clamping small 1 cm bites, cutting with Mayo scissors and ligating with 0 chromic suture with good hemostasis.  Upon reaching the level of the lower uterine segment we switch to the other side and completed the same procedure on the patient left side at this point the mesosalpinx was taken down on the patient left side with Zeppelin clamp Mayo scissors and 0 chromic ligature.  The round ligament and and utero-ovarian ligament were clamped cut and suture-ligated on the patient left side and then the fallopian tube as a final ligation.  Tagging of the round ligament and utero-ovarian ligament were performed.  There was good elevation of the tube and ovary on the side.  The opposite side was treated in similar fashion.  Hemostasis was good except for some oozing out of the posterior cul-de-sac and a small area at the intersection of the left uterosacral ligament and the lower cardinal ligament that required a very superficially placed interrupted suture on the patient left side  Hemostasis was considered satisfactory at this point.  The utero sacral ligaments were loosely reapproximated toward each other by  the use of 2-0 Prolene suture grasping the medial and medial aspects of the uterosacral ligament on each side and weaving in the tissue of the pouch of Douglas and tagging this for future ligation.  The peritoneum was then  closed anterior to posterior with continuous running 2-0 chromic then the 2-0 Prolene suture ligated, loosely reapproximating the uterosacral ligaments toward the midline.  Cuff was then closed with a series of interrupted 0 chromic sutures sewing anterior to posterior with good tissue approximation and hemostasis. Posterior repair at this point sponge and needle counts were correct for closure of the peritoneum.  We then proceeded with posterior repair which involved grasping the hymen remnants at 4:00 and 8:00, infiltrating some additional 10 cc of Marcaine into the area of the posterior cul-de-sac and the posterior vaginal wall.  Sharp dissection from the hymen remnants cephalad distance of approximately 6 cm was performed without difficulty with lateral dissection on each side.  Under the vaginal bib had a double gloved right index finger was placed in the rectum to identify the area of defect in the rectocele was identified is greater than 90 degrees as previously noted.  The lateral tissue that previously constituted the rectovaginal septum was found to have been asymmetrically torn with the majority of the tissue that previously existed in the midline and over to the patient left side found in a bundle on the patient right side.  Allis clamps were used to grasp the tissue and pull it across to allow a site-specific reapproximation.  A series of.  The index finger was removed carefully from under the double draping, gloves changed and then we proceeded with closure the vaginal epithelium was previously opened which allowed interrupted sutures vertical mattress sutures of 0 Vicryl to pull the ends of the identified lax tissues on the right side and a linear fashion over to the patient left side and attached to rebuild the perineal body.  This was subjectively satisfactory and still easily allowed 2 fingerbreadths in 2 of the introitus without difficulty a second layer of horizontal mattress sutures of 2-0  Vicryl x2 was placed to rebuild the perineal body, then epithelium trimmed and a series of continuous running 2-0 chromic used to reapproximate the vaginal epithelium.  Hemostasis was satisfactory at the end of the case, sponge and needle counts were correct.  Vaginal packing with a loose vaginal tape soaked in Betadine with K-Y jelly on it was then placed to allow for pelvic support she went to recovery in stable condition with EBL reported in the 100 cc range.

## 2020-01-31 NOTE — Brief Op Note (Signed)
01/31/2020  11:10 AM  PATIENT:  Alexis [name]  29 y.o. female  PRE-OPERATIVE DIAGNOSIS:  Retroverted Uterus Rectocele Dyspareuia Pelvic relaxation  POST-OPERATIVE DIAGNOSIS:  Retroverted Uterus Rectocele  Dyspareuia Pelvic relaxation  PROCEDURE:  Procedure(s): VAGINAL HYSTERECTOMY WITH REMOVAL OF CERVIX AND UTERUS  (N/A) POSTERIOR REPAIR (RECTOCELE) (N/A)  SURGEON:  Surgeon(s) and Role:    Tilda Burrow, MD - Primary  PHYSICIAN ASSISTANT:   ASSISTANTS: Valetta Close, RNFA  ANESTHESIA:   local and general  EBL:  100 mL   BLOOD ADMINISTERED:none  DRAINS: Urinary Catheter (Foley)   LOCAL MEDICATIONS USED:  MARCAINE    and Amount: 20 ml  SPECIMEN:  Source of Specimen:  Uterus and cervix  DISPOSITION OF SPECIMEN:  PATHOLOGY  COUNTS:  YES  TOURNIQUET:  * No tourniquets in log *  DICTATION: .Dragon Dictation  PLAN OF CARE: Admit for overnight observation  PATIENT DISPOSITION:  PACU - hemodynamically stable.   Delay start of Pharmacological VTE agent (>24hrs) due to surgical blood loss or risk of bleeding: not applicable Details of procedure: Patient was taken the operating room, general anesthesia introduced and patient positioned for procedure.  Timeout was conducted.  Procedure confirmed by operative team.  Perineum and lower abdomen were prepped and draped for vaginal procedure with legs in standard candycane support with perineum position 1 cm past the end of the table.  The weighted short speculum was placed in the vagina and a single Lahey thyroid tenaculum attached to the lip of the cervix.  Cervix was circumscribed with Marcaine with epinephrine and then Bovie cautery used to encircle the cervix at the level of the cervical vaginal junction.  Anteriorly sharp dissection identified the avascular potential space beneath the bladder we were able to dissect upward with a combination of blunt and sharp dissection identifying the anterior peritoneum and  entering without difficulty.  Posteriorly a colpotomy incision was performed which allowed entry into the cul-de-sac without difficulty.  The cervix descended to 1 cm outside the introitus with minimal to moderate traction.  First the uterosacral ligament on the patient left was grasped with a curved Zeppelin clamp transected ligated with 0 chromic and tagged the lower cardinal ligaments on the patient left side were then clamped cut and suture-ligated and standard fashion with Zeppelin clamps, Mayo scissors and 0 chromic suture ligature procedure was repeated on the patient right side.  We then marched up the lateral aspects of the uterus on the patient right side clamping small 1 cm bites, cutting with Mayo scissors and ligating with 0 chromic suture with good hemostasis.  Upon reaching the level of the lower uterine segment we switch to the other side and completed the same procedure on the patient left side at this point the mesosalpinx was taken down on the patient left side with Zeppelin clamp Mayo scissors and 0 chromic ligature.  The round ligament and and utero-ovarian ligament were clamped cut and suture-ligated on the patient left side and then the fallopian tube as a final ligation.  Tagging of the round ligament and utero-ovarian ligament were performed.  There was good elevation of the tube and ovary on the side.  The opposite side was treated in similar fashion.  Hemostasis was good except for some oozing out of the posterior cul-de-sac and a small area at the intersection of the left uterosacral ligament and the lower cardinal ligament that required a very superficially placed interrupted suture on the patient left side  Hemostasis was considered satisfactory  at this point.  The utero sacral ligaments were loosely reapproximated toward each other by the use of 2-0 Prolene suture grasping the medial and medial aspects of the uterosacral ligament on each side and weaving in the tissue of the pouch of  Douglas and tagging this for future ligation.  The peritoneum was then closed anterior to posterior with continuous running 2-0 chromic then the 2-0 Prolene suture ligated, loosely reapproximating the uterosacral ligaments toward the midline.  Cuff was then closed with a series of interrupted 0 chromic sutures sewing anterior to posterior with good tissue approximation and hemostasis. Posterior repair at this point sponge and needle counts were correct for closure of the peritoneum.  We then proceeded with posterior repair which involved grasping the hymen remnants at 4:00 and 8:00, infiltrating some additional 10 cc of Marcaine into the area of the posterior cul-de-sac and the posterior vaginal wall.  Sharp dissection from the hymen remnants cephalad distance of approximately 6 cm was performed without difficulty with lateral dissection on each side.  Under the vaginal bib had a double gloved right index finger was placed in the rectum to identify the area of defect in the rectocele was identified is greater than 90 degrees as previously noted.  The lateral tissue that previously constituted the rectovaginal septum was found to have been asymmetrically torn with the majority of the tissue that previously existed in the midline and over to the patient left side found in a bundle on the patient right side.  Allis clamps were used to grasp the tissue and pull it across to allow a site-specific reapproximation.  A series of.  The index finger was removed carefully from under the double draping, gloves changed and then we proceeded with closure the vaginal epithelium was previously opened which allowed interrupted sutures vertical mattress sutures of 0 Vicryl to pull the ends of the identified lax tissues on the right side and a linear fashion over to the patient left side and attached to rebuild the perineal body.  This was subjectively satisfactory and still easily allowed 2 fingerbreadths in 2 of the introitus  without difficulty a second layer of horizontal mattress sutures of 2-0 Vicryl x2 was placed to rebuild the perineal body, then epithelium trimmed and a series of continuous running 2-0 chromic used to reapproximate the vaginal epithelium.  Hemostasis was satisfactory at the end of the case, sponge and needle counts were correct.  Vaginal packing with a loose vaginal tape soaked in Betadine with K-Y jelly on it was then placed to allow for pelvic support she went to recovery in stable condition with EBL reported in the 100 cc range.

## 2020-02-01 ENCOUNTER — Encounter (HOSPITAL_COMMUNITY): Payer: Self-pay | Admitting: Obstetrics and Gynecology

## 2020-02-01 DIAGNOSIS — N816 Rectocele: Secondary | ICD-10-CM | POA: Diagnosis not present

## 2020-02-01 LAB — CBC
HCT: 33.8 % — ABNORMAL LOW (ref 36.0–46.0)
Hemoglobin: 10.9 g/dL — ABNORMAL LOW (ref 12.0–15.0)
MCH: 32.1 pg (ref 26.0–34.0)
MCHC: 32.2 g/dL (ref 30.0–36.0)
MCV: 99.4 fL (ref 80.0–100.0)
Platelets: 183 10*3/uL (ref 150–400)
RBC: 3.4 MIL/uL — ABNORMAL LOW (ref 3.87–5.11)
RDW: 12.1 % (ref 11.5–15.5)
WBC: 9.7 10*3/uL (ref 4.0–10.5)
nRBC: 0 % (ref 0.0–0.2)

## 2020-02-01 LAB — BASIC METABOLIC PANEL
Anion gap: 5 (ref 5–15)
BUN: 9 mg/dL (ref 6–20)
CO2: 26 mmol/L (ref 22–32)
Calcium: 8.1 mg/dL — ABNORMAL LOW (ref 8.9–10.3)
Chloride: 105 mmol/L (ref 98–111)
Creatinine, Ser: 0.53 mg/dL (ref 0.44–1.00)
GFR calc Af Amer: >60 mL/min (ref >60)
GFR calc non Af Amer: >60 mL/min (ref >60)
Glucose, Bld: 97 mg/dL (ref 70–99)
Potassium: 4 mmol/L (ref 3.5–5.1)
Sodium: 136 mmol/L (ref 135–145)

## 2020-02-01 LAB — SURGICAL PATHOLOGY

## 2020-02-01 LAB — GLUCOSE, CAPILLARY: Glucose-Capillary: 60 mg/dL — ABNORMAL LOW (ref 70–99)

## 2020-02-01 MED ORDER — CHLORHEXIDINE GLUCONATE CLOTH 2 % EX PADS
6.0000 | MEDICATED_PAD | Freq: Every day | CUTANEOUS | Status: DC
  Administered 2020-02-01: 6 via TOPICAL

## 2020-02-01 MED ORDER — SODIUM CHLORIDE 0.9 % IV BOLUS
500.0000 mL | Freq: Once | INTRAVENOUS | Status: AC
  Administered 2020-02-01: 500 mL via INTRAVENOUS

## 2020-02-01 MED ORDER — OXYCODONE-ACETAMINOPHEN 5-325 MG PO TABS
1.0000 | ORAL_TABLET | Freq: Four times a day (QID) | ORAL | Status: DC | PRN
  Administered 2020-02-01: 1 via ORAL
  Administered 2020-02-02 (×3): 2 via ORAL
  Filled 2020-02-01 (×2): qty 2
  Filled 2020-02-01: qty 1
  Filled 2020-02-01: qty 2

## 2020-02-01 MED ORDER — BETHANECHOL CHLORIDE 25 MG PO TABS
25.0000 mg | ORAL_TABLET | Freq: Once | ORAL | Status: AC
  Administered 2020-02-01: 25 mg via ORAL
  Filled 2020-02-01: qty 1

## 2020-02-01 NOTE — Progress Notes (Signed)
Pt unable to void, crying in severe pain. Gave PRN Dilaudid and notified Dr. Emelda Fear. Received verbal order for PO Urecholine and in and out cath. Alesia Banda, RN assisted with straight cath, urine output 600 ml. Pt pain improved and states she got relief after bladder was emptied.

## 2020-02-01 NOTE — Progress Notes (Signed)
1 Day Post-Op Procedure(s) (LRB): VAGINAL HYSTERECTOMY WITH REMOVAL OF CERVIX AND UTERUS  (N/A) POSTERIOR REPAIR (RECTOCELE) (N/A)  Subjective: Patient reports incisional pain and tolerating PO. She has not been up yet. Foley out at 5.  No vomiting. Support system is weak. Lives with boyfriend, Selena Batten, and he " has issues :" according to Cody's mom, Kathee Polite, who is supportive and assists with son's medications.  She will be checking on Tamikia daily though she is not very familiar with Baxter Hire .   Danah's Doctor for Donell Sievert MD, in [place] at "[facility]." office number is [phone]. Message has been left that Linzey has no Subutex and will need a Rx upon d/c.   Objective: I have reviewed patient's vital signs, intake and output and labs. BP 100/65 (BP Location: Right Arm)   Pulse 70   Temp 98.2 F (36.8 C) (Oral)   Resp 14   Ht 5\' 11"  (1.803 m)   Wt 77.1 kg   SpO2 100%   BMI 23.71 kg/m  24 H VS ranges Temp:  [97.6 F (36.4 C)-98.2 F (36.8 C)] 98.2 F (36.8 C) (06/30 0411) Pulse Rate:  [62-89] 70 (06/30 0411) Resp:  [9-18] 14 (06/30 0500) BP: (72-120)/(51-79) 100/65 (06/30 0411) SpO2:  [96 %-100 %] 100 % (06/30 0416) Weight:  [77.1 kg] 77.1 kg (06/29 0801)  General: cooperative, appears stated age, fatigued and no distress Resp: clear to auscultation bilaterally Cardio: regular rate and rhythm, S1, S2 normal, no murmur, click, rub or gallop GI: soft, non-tender; bowel sounds normal; no masses,  no organomegaly and normal findings: bowel sounds normal Extremities: extremities normal, atraumatic, no cyanosis or edema and Homans sign is negative, no sign of DVT Vaginal Bleeding: minimal Vaginal packing removed by RN , inspected , has minimal rbc on packing. CBC Latest Ref Rng & Units 02/01/2020 01/27/2020 06/18/2019  WBC 4.0 - 10.5 K/uL 9.7 5.1 -  Hemoglobin 12.0 - 15.0 g/dL 10.9(L) 12.3 14.3  Hematocrit 36 - 46 % 33.8(L) 36.6 42.0  Platelets 150 - 400 K/uL  183 216 -   CMP Latest Ref Rng & Units 02/01/2020 01/27/2020 06/18/2019  Glucose 70 - 99 mg/dL 97 71 88  BUN 6 - 20 mg/dL 9 9 7   Creatinine 0.44 - 1.00 mg/dL 06/20/2019 5.36  Sodium 135 - 145 mmol/L 136 138 141  Potassium 3.5 - 5.1 mmol/L 4.0 3.8 3.6  Chloride 98 - 111 mmol/L 105 101 104  CO2 22 - 32 mmol/L 26 28 -  Calcium 8.9 - 10.3 mg/dL 8.1(L) 9.3 -  Total Protein 6.5 - 8.1 g/dL - 7.1 -  Total Bilirubin 0.3 - 1.2 mg/dL - 0.5 -  Alkaline Phos 38 - 126 U/L - 54 -  AST 15 - 41 U/L - 23 -  ALT 0 - 44 U/L - 16 -     Assessment: s/p Procedure(s): VAGINAL HYSTERECTOMY WITH REMOVAL OF CERVIX AND UTERUS  (N/A) POSTERIOR REPAIR (RECTOCELE) (N/A): stable Chronic Subutex use, will need to get back on it. Plan: Advance diet Encourage ambulation Discontinue IV fluids discharge home after arrangements to insure Subutex available .   LOS: 0 days    1.44 02/01/2020, 7:27 AM

## 2020-02-01 NOTE — TOC Progression Note (Addendum)
Transition of Care [facility]) - Progression Note    Patient Details  Name: Alexis [name] MRN: [id number] Date of Birth: [birth year]  Transition of Care [facility]) CM/SW Contact  Karn Cassis, [place] Phone Number: 02/01/2020, 12:04 PM  Clinical Narrative:   TOC received consult to assist with renewing Subutex prescription. Pt followed by [facility] and has appointment for Friday. LCSW was informed if MD was to prescribe oxycodone through Friday, pt cannot take Subutex at the same time. [facility] will renew Subutex prescription at appointment Friday. Discussed with pt. MD updated.          Expected Discharge Plan and Services           Expected Discharge Date: 02/01/20                                     Social Determinants of Health (SDOH) Interventions    Readmission Risk Interventions No flowsheet data found.

## 2020-02-01 NOTE — Progress Notes (Signed)
Patient ID: Alexis [name], female   DOB: [birth year], 29 y.o.   MRN: [id number] Patient has been unable to void in 6 hours since catheter removal and vaginal pack removal. I&O catheter to be used, and will Rx Bethanechol 25 mg x 1. Will postpone d/c til able to void.Surgery was uncomplicated.

## 2020-02-01 NOTE — Discharge Summary (Addendum)
Physician Discharge Summary  Patient ID: Alexis [name] MRN: [id number] DOB/AGE: [birth year] 31 y.o.  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  She had received a bowel prep the day before for bowel evacuation with modest results.  There was some social issues she was late 1 hour for the surgery due to an argument between she and her roommate/partner, Cody.  Surgery itself was straightforward with EBL of 100 cc.  Postoperatively the patient remained stable, and was managed with both Subutex 8 mg every 6 hours and Dilaudid PCA pump with adequate control through the night She reports that she had only received enough Subutex to get her to the surgery.  I have reached out to her provider, transitions of care and left a message with phone number and description of Alexis [name]'s circumstance so that prescription can be given and filled from this provider Consults: Home care consulted to assist with getting her Subutex prescription renewed upon discharge [facility], a drug rehab and substance abuse support facility where she receives both Xanax and Subutex.  Postoperative care was complicated by the fact that she had urinary retention and required in and out catheterization x2 on the first postoperative day after removal of the catheter.  The second catheterization was left in place overnight.  The next day urinary retention again occurred with 1000 cc  of urine when she was catheterized.  We left the catheter and will send her home with a Foley catheter in place for 5 days.  She will be placed on Cipro 500 mg twice a day.  CT scan was performed which showed no acute abnormalities related to the surgery.  She has a 5 cm simple cyst which is unrelated to her urinary retention and is a normal finding on her right ovary Significant Diagnostic Studies: labs:  CBC Latest Ref Rng & Units 02/01/2020 01/27/2020 06/18/2019  WBC 4.0 - 10.5 K/uL 9.7 5.1 -  Hemoglobin 12.0 - 15.0 g/dL 10.9(L) 12.3 14.3  Hematocrit 36 - 46 % 33.8(L) 36.6 42.0  Platelets 150 - 400 K/uL 183 216 -   CMP Latest Ref Rng & Units 02/01/2020 01/27/2020 06/18/2019  Glucose 70 - 99 mg/dL 97 71 88  BUN 6 - 20 mg/dL 9 9 7   Creatinine 0.44 - 1.00 mg/dL 5.18 8.41  Sodium 135 - 145 mmol/L 136 138 141  Potassium 3.5 - 5.1 mmol/L 4.0 3.8 3.6  Chloride 98 - 111 mmol/L 105 101 104  CO2 22 - 32 mmol/L 26 28 -  Calcium 8.9 - 10.3 mg/dL 8.1(L) 9.3 -  Total Protein 6.5 - 8.1 g/dL - 7.1 -  Total Bilirubin 0.3 - 1.2 mg/dL - 0.5 -  Alkaline Phos 38 - 126 U/L - 54 -  AST 15 - 41 U/L - 23 -  ALT 0 - 44 U/L - 16 -     Treatments: surgery: Vaginal hysterectomy posterior repair In and out catheterization x2 placement of urinary catheter prior to discharge Discharge Exam:  she is somewhat somnolent and has multiple dissatisfactions with how she  is doing.  She claims she did not receive stool softener while in the hospital.  She claims she did not get her o'clock suppository this morning.  She blames her constant chronic constipation on these items rather than her opiate use Fortunately her boyfriend's mother is supportive and will assist in her home care, and I have gone over the management of the catheter through the weekend Disposition: Discharge disposition: 01-Home or Self Care     Discharge home once Subutex prescription available will be additionally given a prescription for an  additional 20 oxycodone, and Toradol oral, and MiraLAX to be taken daily to avoid constipation due to opioid-induced constipation or chronic constipation Discharge Instructions    Call MD for:  persistant nausea and vomiting   Complete by: As directed    Call MD for:  severe uncontrolled pain   Complete by: As directed    Call MD for:  temperature >100.4   Complete by: As directed    Diet - low sodium heart healthy   Complete by: As directed    Discharge instructions   Complete by: As directed    Please work with your doctor at [facility] to renew your xanax, and your Subutex.   Discharge wound care:   Complete by: As directed    No douching or tampons or sex, nothing per vagina x 6 wks. And after exam by MD postop   Increase activity slowly   Complete by: As directed        Follow-up Information    Tilda Burrow, MD Follow up in 2 week(s).   Specialties: Obstetrics and Gynecology, Radiology Contact information: [address] [place] [address] [phone]             She will have the catheter removed at home and will be instructed in how to remove the catheter Tuesday morning will call the office if she has any urinary retention problems so that she could come to the office for and out catheterization during the day  Signed: Tilda Burrow 02/01/2020, 8:16 AM

## 2020-02-01 NOTE — Progress Notes (Signed)
Subjective: Patient reports incisional pain.  unablet to void , has been I&O Cath'd x 2 with foley left in this second time. Did not respond to urecholine x 25 mg po/   Objective: I have reviewed patient's vital signs and intake and output.     Assessment/Plan: 1. Reinsert foley, leave in 2. canel d/c 3 po oxycodone for pain.  LOS: 0 days    Tilda Burrow 02/01/2020, 4:04 PM

## 2020-02-01 NOTE — Progress Notes (Signed)
Pt unable to void since in and out cath, bladder scan shows 126 ml. Made Dr. Emelda Fear aware and received order for 500 cc bolus.

## 2020-02-01 NOTE — Addendum Note (Signed)
Addendum  created 02/01/20 0950 by Franco Nones, CRNA   Charge Capture section accepted

## 2020-02-01 NOTE — Progress Notes (Signed)
Pt remains unable to void, bladder scan shows 434 ml. Notified Dr. Emelda Fear and received order to place Foley and cancel discharge.

## 2020-02-01 NOTE — Discharge Instructions (Signed)
Dr Emelda Fear may be reached through the weekend on [phone] cell  Vaginal Hysterectomy, Care After Refer to this sheet in the next few weeks. These instructions provide you with information about caring for yourself after your procedure. Your health care provider may also give you more specific instructions. Your treatment has been planned according to current medical practices, but problems sometimes occur. Call your health care provider if you have any problems or questions after your procedure. What can I expect after the procedure? After the procedure, it is common to have:  Pain.  Soreness and numbness in your incision areas.  Vaginal bleeding and discharge.  Constipation.  Temporary problems emptying the bladder.  Feelings of sadness or other emotions. Follow these instructions at home: Medicines  Take over-the-counter and prescription medicines only as told by your health care provider.  If you were prescribed an antibiotic medicine, take it as told by your health care provider. Do not stop taking the antibiotic even if you start to feel better.  Do not drive or operate heavy machinery while taking prescription pain medicine. Activity  Return to your normal activities as told by your health care provider. Ask your health care provider what activities are safe for you.  Get regular exercise as told by your health care provider. You may be told to take short walks every day and go farther each time.  Do not lift anything that is heavier than 10 lb (4.5 kg). General instructions   Do not put anything in your vagina for 6 weeks after your surgery or as told by your health care provider. This includes tampons and douches.  Do not have sex until your health care provider says you can.  Do not take baths, swim, or use a hot tub until your health care provider approves.  Drink enough fluid to keep your urine clear or pale yellow.  Do not drive for 24 hours if you were  given a sedative.  Keep all follow-up visits as told by your health care provider. This is important. Contact a health care provider if:  Your pain medicine is not helping.  You have a fever.of 100.0 or more   You have redness, swelling, or pain at your incision site.  You have blood, pus, or a bad-smelling discharge from your vagina.  You continue to have difficulty urinating. Get help right away if:  You have severe abdominal or back pain.  You have heavy bleeding from your vagina.  You have chest pain or shortness of breath. This information is not intended to replace advice given to you by your health care provider. Make sure you discuss any questions you have with your health care provider. Document Revised: 03/13/2016 Document Reviewed: 08/05/2015 Elsevier Patient Education  2020 [employer].

## 2020-02-02 ENCOUNTER — Observation Stay (HOSPITAL_COMMUNITY): Payer: Medicaid Other

## 2020-02-02 DIAGNOSIS — N83291 Other ovarian cyst, right side: Secondary | ICD-10-CM | POA: Diagnosis not present

## 2020-02-02 DIAGNOSIS — N816 Rectocele: Secondary | ICD-10-CM | POA: Diagnosis not present

## 2020-02-02 DIAGNOSIS — K5900 Constipation, unspecified: Secondary | ICD-10-CM | POA: Diagnosis not present

## 2020-02-02 DIAGNOSIS — Z90710 Acquired absence of both cervix and uterus: Secondary | ICD-10-CM | POA: Diagnosis not present

## 2020-02-02 DIAGNOSIS — N3289 Other specified disorders of bladder: Secondary | ICD-10-CM | POA: Diagnosis not present

## 2020-02-02 LAB — CBC
HCT: 31.5 % — ABNORMAL LOW (ref 36.0–46.0)
Hemoglobin: 10.6 g/dL — ABNORMAL LOW (ref 12.0–15.0)
MCH: 32.8 pg (ref 26.0–34.0)
MCHC: 33.7 g/dL (ref 30.0–36.0)
MCV: 97.5 fL (ref 80.0–100.0)
Platelets: 179 10*3/uL (ref 150–400)
RBC: 3.23 MIL/uL — ABNORMAL LOW (ref 3.87–5.11)
RDW: 12 % (ref 11.5–15.5)
WBC: 4.6 10*3/uL (ref 4.0–10.5)
nRBC: 0 % (ref 0.0–0.2)

## 2020-02-02 LAB — BASIC METABOLIC PANEL
Anion gap: 6 (ref 5–15)
BUN: 10 mg/dL (ref 6–20)
CO2: 25 mmol/L (ref 22–32)
Calcium: 8.4 mg/dL — ABNORMAL LOW (ref 8.9–10.3)
Chloride: 106 mmol/L (ref 98–111)
Creatinine, Ser: 0.51 mg/dL (ref 0.44–1.00)
GFR calc Af Amer: >60 mL/min (ref >60)
GFR calc non Af Amer: >60 mL/min (ref >60)
Glucose, Bld: 88 mg/dL (ref 70–99)
Potassium: 3.8 mmol/L (ref 3.5–5.1)
Sodium: 137 mmol/L (ref 135–145)

## 2020-02-02 MED ORDER — IOHEXOL 9 MG/ML PO SOLN
ORAL | Status: AC
  Administered 2020-02-02: 500 mL
  Filled 2020-02-02: qty 1000

## 2020-02-02 MED ORDER — IOHEXOL 300 MG/ML  SOLN
100.0000 mL | Freq: Once | INTRAMUSCULAR | Status: AC | PRN
  Administered 2020-02-02: 100 mL via INTRAVENOUS

## 2020-02-02 MED ORDER — SODIUM CHLORIDE 0.9 % IV SOLN: INTRAVENOUS | Status: DC

## 2020-02-02 MED ORDER — OXYCODONE-ACETAMINOPHEN 5-325 MG PO TABS: 1.0000 | ORAL_TABLET | Freq: Four times a day (QID) | ORAL | 0 refills | Status: DC | PRN

## 2020-02-02 MED ORDER — BISACODYL 10 MG RE SUPP
10.0000 mg | Freq: Once | RECTAL | Status: AC
  Administered 2020-02-02: 10 mg via RECTAL
  Filled 2020-02-02: qty 1

## 2020-02-02 MED ORDER — CIPROFLOXACIN HCL 500 MG PO TABS
500.0000 mg | ORAL_TABLET | Freq: Two times a day (BID) | ORAL | 0 refills | Status: DC
Start: 2020-02-02 — End: 2020-02-15

## 2020-02-02 MED ORDER — BETHANECHOL CHLORIDE 10 MG PO TABS
10.0000 mg | ORAL_TABLET | Freq: Three times a day (TID) | ORAL | Status: AC
  Administered 2020-02-02 (×2): 10 mg via ORAL
  Filled 2020-02-02 (×2): qty 1

## 2020-02-02 NOTE — Progress Notes (Addendum)
Nsg Discharge Note  Admit Date:  01/31/2020 Discharge date: 02/02/2020   Alexis [name] to be D/C'd Home per MD order.  AVS completed.  Copy for chart, and copy for patient signed, and dated. Removed IV-clean, dry, intact. Showed patient how to clean foley with foley wipes. Also showed her how to remove foley as directed by Dr. Emelda Fear and gave her supplies. Reviewed d/c paperwork with patient and boyfriend's mother. Answered all questions. Wheeled stable patient and belongings to main entrance where she was picked up to d/c to home. Patient/caregiver able to verbalize understanding.  Discharge Medication: Allergies as of 02/02/2020      Reactions   Naloxone Hives, Swelling   Tongue swelling, hives, throat swelling, angioedema of limbs   Tramadol Hives, Other (See Comments)   "seizure like activity."   Vicodin [hydrocodone-acetaminophen] Nausea And Vomiting      Medication List    STOP taking these medications   amphetamine-dextroamphetamine 30 MG tablet Commonly known as: ADDERALL   estradiol 0.5 MG tablet Commonly known as: ESTRACE     TAKE these medications   ALPRAZolam 0.5 MG tablet Commonly known as: XANAX Take 0.5 mg by mouth 3 (three) times daily as needed for anxiety.   buprenorphine 8 MG Subl SL tablet Commonly known as: SUBUTEX Place 8 mg under the tongue 3 (three) times daily. Notes to patient: Last dose given today at 9:05 am   ciprofloxacin 500 MG tablet Commonly known as: Cipro Take 1 tablet (500 mg total) by mouth 2 (two) times daily.   gabapentin 400 MG capsule Commonly known as: NEURONTIN Take 400 mg by mouth 3 (three) times daily. Notes to patient: Last dose given today at 9:05 am   linaclotide 290 MCG Caps capsule Commonly known as: Linzess Take 1 capsule (290 mcg total) by mouth daily before breakfast. What changed:   when to take this  reasons to take this   oxyCODONE-acetaminophen 5-325 MG tablet Commonly known as: PERCOCET/ROXICET Take  1-2 tablets by mouth every 6 (six) hours as needed for moderate pain or severe pain.   Prevacid 30 MG capsule Generic drug: lansoprazole Take 30 mg by mouth daily as needed (heartburn).   SEROquel 300 MG tablet Generic drug: QUEtiapine Take 300 mg by mouth daily as needed (sleep).   Vyvanse 70 MG capsule Generic drug: lisdexamfetamine Take 70 mg by mouth daily as needed (when working).            Discharge Care Instructions  (From admission, onward)         Start     Ordered   02/01/20 0000  Discharge wound care:       Comments: No douching or tampons or sex, nothing per vagina x 6 wks. And after exam by MD postop   02/01/20 0816          Discharge Assessment: Vitals:   02/02/20 0501 02/02/20 1628  BP: 109/75 106/68  Pulse: 78 62  Resp: 18 18  Temp: 99 F (37.2 C)   SpO2: 98% 99%   Skin clean, dry and intact without evidence of skin break down, no evidence of skin tears noted. IV catheter discontinued intact. Site without signs and symptoms of complications - no redness or edema noted at insertion site, patient denies c/o pain - only slight tenderness at site.  Dressing with slight pressure applied.  D/c Instructions-Education: Discharge instructions given to patient/family with verbalized understanding. D/c education completed with patient/family including follow up instructions, medication list, d/c activities  limitations if indicated, with other d/c instructions as indicated by MD - patient able to verbalize understanding, all questions fully answered. Patient instructed to return to ED, call 911, or call MD for any changes in condition.  Patient escorted via WC, and D/C home via private auto.  Karolee Ohs, RN 02/02/2020 7:53 PM

## 2020-02-02 NOTE — Progress Notes (Signed)
2 Days Post-Op Procedure(s) (LRB): VAGINAL HYSTERECTOMY WITH REMOVAL OF CERVIX AND UTERUS  (N/A) POSTERIOR REPAIR (RECTOCELE) (N/A)  Subjective: Patient reports incisional pain.   She was once again unable to void and we put the catheter back and obtain the 1000 cc of retained urine we will leave the catheter in over the weekend with removal on Tuesday Objective: I have reviewed patient's radiology results and CT is normal..  Vaginal Bleeding: minimal  Assessment: s/p Procedure(s): VAGINAL HYSTERECTOMY WITH REMOVAL OF CERVIX AND UTERUS  (N/A) POSTERIOR REPAIR (RECTOCELE) (N/A): Acute urinary retention postop I do not see any complication related to the surgery.  She is remained somnolent due to her medications and low pain tolerance and is "getting aggravated" at her slow recovery which is of course everyone else was responsibility Plan: Discharge home with Foley catheter in place, placed on Cipro through the weekend.  She has [facility] appointment [facility] for Sub[facility]ex refill.  I will give her 12 oxycodone to get her through the next couple of days after which the Subutex should be plenty she had enough Xanax to last her until her next appointment at the clinic, [facility]" in [place]  LOS: 0 days    Alexis [name] 02/02/2020, 6:30 PM

## 2020-02-02 NOTE — Progress Notes (Signed)
2 Days Post-Op Procedure(s) (LRB): VAGINAL HYSTERECTOMY WITH REMOVAL OF CERVIX AND UTERUS  (N/A) POSTERIOR REPAIR (RECTOCELE) (N/A)  Subjective: Patient reports incisional pain, tolerating PO and + flatus.  She had  Urinary retention  Yesterday, and was unable to void.  I & O catheterization was done x 1, then on second catheterization, the foley was left in place.  This morning she is sleeping, and reports through closed eyes that she hurt all night, but allows exam without guarding or rebound. She is responsive but somewhat resistant to communication. + flatus, tolerated po foods yesterday  Objective: I have reviewed patient's vital signs, intake and output and pathology. BP 109/75 (BP Location: Left Arm)   Pulse 78   Temp 99 F (37.2 C)   Resp 18   Ht 5\' 11"  (1.803 m)   Wt 77.1 kg   SpO2 98%   BMI 23.71 kg/m   General: no distress GI: normal findings: soft, non-tender Extremities: extremities normal, atraumatic, no cyanosis or edema and Homans sign is negative, no sign of DVT Vaginal Bleeding: minimal and old dark on inner thigh, vaginal exam with rn support shows good perineal reapproximation, same as postop exam at end of case, and vaginal cuff is closed, well supported, no blood on glove, no clots in vagina, no distortion, and pt does not react  significantly to exam   Assessment: s/p Procedure(s): VAGINAL HYSTERECTOMY WITH REMOVAL OF CERVIX AND UTERUS  (N/A) POSTERIOR REPAIR (RECTOCELE) (N/A): stable and postop urinary retention  Plan: d/c foley repeat cbc bmet, and Rx Urecholine x 10 mg x 2 if able to void, will d/c at noon, if retention, will go home with catheter.  LOS: 0 days    02/02/2020, 6:47 AM

## 2020-02-02 NOTE — Plan of Care (Signed)

## 2020-02-15 ENCOUNTER — Telehealth: Payer: Self-pay | Admitting: *Deleted

## 2020-02-15 ENCOUNTER — Encounter: Payer: Self-pay | Admitting: Obstetrics and Gynecology

## 2020-02-15 ENCOUNTER — Encounter: Payer: Self-pay | Admitting: *Deleted

## 2020-02-15 ENCOUNTER — Ambulatory Visit (INDEPENDENT_AMBULATORY_CARE_PROVIDER_SITE_OTHER): Payer: Medicaid Other | Admitting: Obstetrics and Gynecology

## 2020-02-15 VITALS — BP 106/62 | HR 87 | Ht 71.0 in | Wt 167.6 lb

## 2020-02-15 DIAGNOSIS — Z09 Encounter for follow-up examination after completed treatment for conditions other than malignant neoplasm: Secondary | ICD-10-CM

## 2020-02-15 DIAGNOSIS — Z90710 Acquired absence of both cervix and uterus: Secondary | ICD-10-CM

## 2020-02-15 NOTE — Telephone Encounter (Signed)
Mychart message sent to patient.

## 2020-02-15 NOTE — Progress Notes (Signed)
Patient ID: Alexis [name], female   DOB: 1990-09-17, 29 y.o.   MRN: [id number]  Subjective:  Alexis [name] is a 29 y.o. female now 2 weeks status post vaginal hysterectomy with removal of cervix and posterior repair.  She has actually been doing excellently..  She had postop urinary retention immediately postop and had to go home with a catheter in place.  She taken out on her own and did well.  She completed the antibiotic regimen  She was able to remove her catheter on her own. She reports that her bowel movements feel "smoother." She does not feel any pain right now, though she does feel some pressure when she moves her bowel.  Review of Systems   Diet:   negative   Bowel movements: Feel "smoother." Some pressure with bowel movements.  The patient is not having any pain.  Objective:  BP 106/62 (BP Location: Right Arm, Patient Position: Sitting, Cuff Size: Normal)   Pulse 87   Ht 5\' 11"  (1.803 m)   Wt 167 lb 9.6 oz (76 kg)   LMP 06/23/2019   BMI 23.38 kg/m  General:Well developed, well nourished.  No acute distress. Abdomen: Bowel sounds normal, soft, non-tender. Pelvic Exam:    External Genitalia:  Normal.    Vagina: Normal. Little secretions. Back wall is firmer than it was before. A few stitches are still in place.    Cervix: surgically removed    Uterus: surgically removed    Adnexa/Bimanual: Normal  Incision(s): Healing well. A few stitches still in place.   Assessment:  Post-Op 2 weeks s/p vaginal hysterectomy with removal of cervix and posterior repair.   Doing very well postoperatively.   Plan:  1. Wound care discussed. 2.   Current medications. 3.   Activity restrictions: Refrain from sexual activity. Limit lifting to 15 lbs. 4.   Return to work: patient will call to find out about lifting requirements. 5.   Follow up in 1 month for post op and Nexplanon removal  By signing my name below, I, 06/25/2019, attest that this documentation has been  prepared under the direction and in the presence of Pietro Cassis, MD. Electronically Signed: [name], Medical Scribe. 02/15/20. 10:51 AM.  I personally performed the services described in this documentation, which was SCRIBED in my presence. The recorded information has been reviewed and considered accurate. It has been edited as necessary during review. 02/17/20, MD

## 2020-02-16 ENCOUNTER — Encounter: Payer: Self-pay | Admitting: Obstetrics and Gynecology

## 2020-02-17 DIAGNOSIS — F411 Generalized anxiety disorder: Secondary | ICD-10-CM | POA: Diagnosis not present

## 2020-02-17 DIAGNOSIS — F1124 Opioid dependence with opioid-induced mood disorder: Secondary | ICD-10-CM | POA: Diagnosis not present

## 2020-02-17 DIAGNOSIS — F900 Attention-deficit hyperactivity disorder, predominantly inattentive type: Secondary | ICD-10-CM | POA: Diagnosis not present

## 2020-03-02 DIAGNOSIS — F411 Generalized anxiety disorder: Secondary | ICD-10-CM | POA: Diagnosis not present

## 2020-03-02 DIAGNOSIS — F900 Attention-deficit hyperactivity disorder, predominantly inattentive type: Secondary | ICD-10-CM | POA: Diagnosis not present

## 2020-03-02 DIAGNOSIS — F1124 Opioid dependence with opioid-induced mood disorder: Secondary | ICD-10-CM | POA: Diagnosis not present

## 2020-03-13 NOTE — Progress Notes (Signed)
PATIENT ID: Alexis [name], female     DOB: 22-Feb-1991, 29 y.o.     MRN: [id number]    Subjective:  Alexis [name] is a 29 y.o. female now 2 weeks status post vaginal hysterectomy and cervicectomy, and rectocele.  And also nexplanon removal  pt had a nexplanon that was not removed til now.   GYNECOLOGY CLINIC PROCEDURE NOTE  Alexis [name] is a 29 y.o. [phone] here for Nexplanon removal. No other gynecologic concerns.  Nexplanon Removal Patient identified, informed consent performed, consent signed.   Appropriate time out taken. Nexplanon site identified.  Area prepped in usual sterile fashon. One ml of 1% lidocaine was used to anesthetize the area at the distal end of the implant. A small stab incision was made right beside the implant on the distal portion.  The Nexplanon rod was grasped using hemostats and removed without difficulty.  There was minimal blood loss. There were no complications.  3 ml of 1% lidocaine was injected around the incision for post-procedure analgesia.  Steri-strips were applied over the small incision.  A pressure bandage was applied to reduce any bruising.  The patient tolerated the procedure well and was given post procedure instructions.  Patient is s/p hysterectomy.   Review of Systems Negative except anxious, chronic constipation on Linzess   Diet:   normal   Bowel movements : chronic constipation.  The patient is not having any pain.  Objective:  LMP 06/23/2019  General:Well developed, well nourished.  No acute distress. Abdomen: Bowel sounds normal, soft, non-tender. Pelvic Exam:    External Genitalia:  Normal.    Vagina: Normal excellent posterior healing    Cervix: absent    Uterus: absent cuff firm good support , slightly irregular    Adnexa/Bimanual: Normal for postop state, slight thickening on cuff no complaints of pain.  Incision(s):   Cuff healed on surface, sllight irregularity and stiffness , will gradually improve.      Assessment:  Post-Op 2 weeks s/p vaginal hysterectomy and cervicectomy, and rectocele   Doing well  Postoperatively. Is working 12 hour shifts already     Plan:  1. Wound care discussed  Re cuff, and also nexplanon removal 2. Current medications.reviewed 3. Activity restrictions: none 4. Return to work: at work now. 5. Follow up in prn weeks.   By signing my name below, I, [employer], attest that this documentation has been prepared under the direction and in the presence of Tilda Burrow, MD. Electronically Signed: Mal Misty Medical Scribe. 03/13/20. 11:27 PM.  I personally performed the services described in this documentation, which was SCRIBED in my presence. The recorded information has been reviewed and considered accurate. It has been edited as necessary during review. Tilda Burrow, MD

## 2020-03-14 ENCOUNTER — Ambulatory Visit (INDEPENDENT_AMBULATORY_CARE_PROVIDER_SITE_OTHER): Payer: Medicaid Other | Admitting: Obstetrics and Gynecology

## 2020-03-14 ENCOUNTER — Encounter: Payer: Self-pay | Admitting: Obstetrics and Gynecology

## 2020-03-14 VITALS — BP 131/78 | HR 102 | Ht 71.0 in | Wt 168.8 lb

## 2020-03-14 DIAGNOSIS — Z3046 Encounter for surveillance of implantable subdermal contraceptive: Secondary | ICD-10-CM

## 2020-03-14 DIAGNOSIS — Z09 Encounter for follow-up examination after completed treatment for conditions other than malignant neoplasm: Secondary | ICD-10-CM

## 2020-03-14 DIAGNOSIS — Z90710 Acquired absence of both cervix and uterus: Secondary | ICD-10-CM

## 2020-03-14 NOTE — Patient Instructions (Signed)
Etonogestrel implant What is this medicine? ETONOGESTREL (et oh noe JES trel) is a contraceptive (birth control) device. It is used to prevent pregnancy. It can be used for up to 3 years. This medicine may be used for other purposes; ask your health care provider or pharmacist if you have questions. COMMON BRAND NAME(S): Implanon, Nexplanon What should I tell my health care provider before I take this medicine? They need to know if you have any of these conditions:  abnormal vaginal bleeding  blood vessel disease or blood clots  breast, cervical, endometrial, ovarian, liver, or uterine cancer  diabetes  gallbladder disease  heart disease or recent heart attack  high blood pressure  high cholesterol or triglycerides  kidney disease  liver disease  migraine headaches  seizures  stroke  tobacco smoker  an unusual or allergic reaction to etonogestrel, anesthetics or antiseptics, other medicines, foods, dyes, or preservatives  pregnant or trying to get pregnant  breast-feeding How should I use this medicine? This device is inserted just under the skin on the inner side of your upper arm by a health care professional. Talk to your pediatrician regarding the use of this medicine in children. Special care may be needed. Overdosage: If you think you have taken too much of this medicine contact a poison control center or emergency room at once. NOTE: This medicine is only for you. Do not share this medicine with others. What if I miss a dose? This does not apply. What may interact with this medicine? Do not take this medicine with any of the following medications:  amprenavir  fosamprenavir This medicine may also interact with the following medications:  acitretin  aprepitant  armodafinil  bexarotene  bosentan  carbamazepine  certain medicines for fungal infections like fluconazole, ketoconazole, itraconazole and voriconazole  certain medicines to treat  hepatitis, HIV or AIDS  cyclosporine  felbamate  griseofulvin  lamotrigine  modafinil  oxcarbazepine  phenobarbital  phenytoin  primidone  rifabutin  rifampin  rifapentine  [place]. Dalyl[place] Ramey's wort  topiramate This list may not describe all possible interactions. Give your health care provider a list of all the medicines, herbs, non-prescription drugs, or dietary supplements you use. Also tell them if you smoke, drink alcohol, or use illegal drugs. Some items may interact with your medicine. What should I watch for while using this medicine? This product does not protect you against HIV infection (AIDS) or other sexually transmitted diseases. You should be able to feel the implant by pressing your fingertips over the skin where it was inserted. Contact your doctor if you cannot feel the implant, and use a non-hormonal birth control method (such as condoms) until your doctor confirms that the implant is in place. Contact your doctor if you think that the implant may have broken or become bent while in your arm. You will receive a user card from your health care provider after the implant is inserted. The card is a record of the location of the implant in your upper arm and when it should be removed. Keep this card with your health records. What side effects may I notice from receiving this medicine? Side effects that you should report to your doctor or health care professional as soon as possible:  allergic reactions like skin rash, itching or hives, swelling of the face, lips, or tongue  breast lumps, breast tissue changes, or discharge  breathing problems  changes in emotions or moods  coughing up blood  if you feel that the implant   may have broken or bent while in your arm  high blood pressure  pain, irritation, swelling, or bruising at the insertion site  scar at site of insertion  signs of infection at the insertion site such as fever, and skin redness, pain or  discharge  signs and symptoms of a blood clot such as breathing problems; changes in vision; chest pain; severe, sudden headache; pain, swelling, warmth in the leg; trouble speaking; sudden numbness or weakness of the face, arm or leg  signs and symptoms of liver injury like dark yellow or brown urine; general ill feeling or flu-like symptoms; light-colored stools; loss of appetite; nausea; right upper belly pain; unusually weak or tired; yellowing of the eyes or skin  unusual vaginal bleeding, discharge Side effects that usually do not require medical attention (report to your doctor or health care professional if they continue or are bothersome):  acne  breast pain or tenderness  headache  irregular menstrual bleeding  nausea This list may not describe all possible side effects. Call your doctor for medical advice about side effects. You may report side effects to FDA at 1-800-FDA-1088. Where should I keep my medicine? This drug is given in a hospital or clinic and will not be stored at home. NOTE: This sheet is a summary. It may not cover all possible information. If you have questions about this medicine, talk to your doctor, pharmacist, or health care provider.  2020 Elsevier/Gold Standard (2019-05-03 11:33:04)  

## 2020-03-16 DIAGNOSIS — F411 Generalized anxiety disorder: Secondary | ICD-10-CM | POA: Diagnosis not present

## 2020-03-16 DIAGNOSIS — F1124 Opioid dependence with opioid-induced mood disorder: Secondary | ICD-10-CM | POA: Diagnosis not present

## 2020-03-16 DIAGNOSIS — F900 Attention-deficit hyperactivity disorder, predominantly inattentive type: Secondary | ICD-10-CM | POA: Diagnosis not present

## 2020-03-30 DIAGNOSIS — F900 Attention-deficit hyperactivity disorder, predominantly inattentive type: Secondary | ICD-10-CM | POA: Diagnosis not present

## 2020-03-30 DIAGNOSIS — F411 Generalized anxiety disorder: Secondary | ICD-10-CM | POA: Diagnosis not present

## 2020-03-30 DIAGNOSIS — F1124 Opioid dependence with opioid-induced mood disorder: Secondary | ICD-10-CM | POA: Diagnosis not present

## 2020-04-13 DIAGNOSIS — F900 Attention-deficit hyperactivity disorder, predominantly inattentive type: Secondary | ICD-10-CM | POA: Diagnosis not present

## 2020-04-13 DIAGNOSIS — F1124 Opioid dependence with opioid-induced mood disorder: Secondary | ICD-10-CM | POA: Diagnosis not present

## 2020-04-13 DIAGNOSIS — F411 Generalized anxiety disorder: Secondary | ICD-10-CM | POA: Diagnosis not present

## 2020-04-27 DIAGNOSIS — F1124 Opioid dependence with opioid-induced mood disorder: Secondary | ICD-10-CM | POA: Diagnosis not present

## 2020-04-27 DIAGNOSIS — F900 Attention-deficit hyperactivity disorder, predominantly inattentive type: Secondary | ICD-10-CM | POA: Diagnosis not present

## 2020-04-27 DIAGNOSIS — F411 Generalized anxiety disorder: Secondary | ICD-10-CM | POA: Diagnosis not present

## 2020-05-11 DIAGNOSIS — F1124 Opioid dependence with opioid-induced mood disorder: Secondary | ICD-10-CM | POA: Diagnosis not present

## 2020-05-11 DIAGNOSIS — F900 Attention-deficit hyperactivity disorder, predominantly inattentive type: Secondary | ICD-10-CM | POA: Diagnosis not present

## 2020-05-11 DIAGNOSIS — F411 Generalized anxiety disorder: Secondary | ICD-10-CM | POA: Diagnosis not present

## 2020-05-15 DIAGNOSIS — F1124 Opioid dependence with opioid-induced mood disorder: Secondary | ICD-10-CM | POA: Diagnosis not present

## 2020-05-25 DIAGNOSIS — F411 Generalized anxiety disorder: Secondary | ICD-10-CM | POA: Diagnosis not present

## 2020-05-25 DIAGNOSIS — F900 Attention-deficit hyperactivity disorder, predominantly inattentive type: Secondary | ICD-10-CM | POA: Diagnosis not present

## 2020-05-25 DIAGNOSIS — F1124 Opioid dependence with opioid-induced mood disorder: Secondary | ICD-10-CM | POA: Diagnosis not present

## 2020-06-08 DIAGNOSIS — F1124 Opioid dependence with opioid-induced mood disorder: Secondary | ICD-10-CM | POA: Diagnosis not present

## 2020-06-08 DIAGNOSIS — F900 Attention-deficit hyperactivity disorder, predominantly inattentive type: Secondary | ICD-10-CM | POA: Diagnosis not present

## 2020-06-08 DIAGNOSIS — Z20828 Contact with and (suspected) exposure to other viral communicable diseases: Secondary | ICD-10-CM | POA: Diagnosis not present

## 2020-06-08 DIAGNOSIS — F411 Generalized anxiety disorder: Secondary | ICD-10-CM | POA: Diagnosis not present

## 2020-06-27 DIAGNOSIS — F1124 Opioid dependence with opioid-induced mood disorder: Secondary | ICD-10-CM | POA: Diagnosis not present

## 2020-07-02 DIAGNOSIS — F411 Generalized anxiety disorder: Secondary | ICD-10-CM | POA: Diagnosis not present

## 2020-07-02 DIAGNOSIS — F1124 Opioid dependence with opioid-induced mood disorder: Secondary | ICD-10-CM | POA: Diagnosis not present

## 2020-07-09 DIAGNOSIS — F1124 Opioid dependence with opioid-induced mood disorder: Secondary | ICD-10-CM | POA: Diagnosis not present

## 2020-07-09 DIAGNOSIS — F411 Generalized anxiety disorder: Secondary | ICD-10-CM | POA: Diagnosis not present

## 2020-07-16 DIAGNOSIS — F1124 Opioid dependence with opioid-induced mood disorder: Secondary | ICD-10-CM | POA: Diagnosis not present

## 2020-07-16 DIAGNOSIS — F411 Generalized anxiety disorder: Secondary | ICD-10-CM | POA: Diagnosis not present

## 2020-07-16 DIAGNOSIS — F900 Attention-deficit hyperactivity disorder, predominantly inattentive type: Secondary | ICD-10-CM | POA: Diagnosis not present

## 2020-07-19 DIAGNOSIS — R911 Solitary pulmonary nodule: Secondary | ICD-10-CM | POA: Diagnosis not present

## 2020-07-23 DIAGNOSIS — F411 Generalized anxiety disorder: Secondary | ICD-10-CM | POA: Diagnosis not present

## 2020-07-23 DIAGNOSIS — F900 Attention-deficit hyperactivity disorder, predominantly inattentive type: Secondary | ICD-10-CM | POA: Diagnosis not present

## 2020-07-23 DIAGNOSIS — F1124 Opioid dependence with opioid-induced mood disorder: Secondary | ICD-10-CM | POA: Diagnosis not present

## 2020-08-06 DIAGNOSIS — F1124 Opioid dependence with opioid-induced mood disorder: Secondary | ICD-10-CM | POA: Diagnosis not present

## 2020-08-16 DIAGNOSIS — F1124 Opioid dependence with opioid-induced mood disorder: Secondary | ICD-10-CM | POA: Diagnosis not present

## 2020-08-17 DIAGNOSIS — F1124 Opioid dependence with opioid-induced mood disorder: Secondary | ICD-10-CM | POA: Diagnosis not present

## 2020-08-17 DIAGNOSIS — F411 Generalized anxiety disorder: Secondary | ICD-10-CM | POA: Diagnosis not present

## 2020-08-18 DIAGNOSIS — F411 Generalized anxiety disorder: Secondary | ICD-10-CM | POA: Diagnosis not present

## 2020-08-18 DIAGNOSIS — F1124 Opioid dependence with opioid-induced mood disorder: Secondary | ICD-10-CM | POA: Diagnosis not present

## 2020-08-18 DIAGNOSIS — F900 Attention-deficit hyperactivity disorder, predominantly inattentive type: Secondary | ICD-10-CM | POA: Diagnosis not present

## 2020-09-03 DIAGNOSIS — F411 Generalized anxiety disorder: Secondary | ICD-10-CM | POA: Diagnosis not present

## 2020-09-03 DIAGNOSIS — F900 Attention-deficit hyperactivity disorder, predominantly inattentive type: Secondary | ICD-10-CM | POA: Diagnosis not present

## 2020-09-03 DIAGNOSIS — F1124 Opioid dependence with opioid-induced mood disorder: Secondary | ICD-10-CM | POA: Diagnosis not present

## 2020-09-17 DIAGNOSIS — Z79899 Other long term (current) drug therapy: Secondary | ICD-10-CM | POA: Diagnosis not present

## 2020-09-21 DIAGNOSIS — F411 Generalized anxiety disorder: Secondary | ICD-10-CM | POA: Diagnosis not present

## 2020-09-21 DIAGNOSIS — F1124 Opioid dependence with opioid-induced mood disorder: Secondary | ICD-10-CM | POA: Diagnosis not present

## 2020-09-21 DIAGNOSIS — F900 Attention-deficit hyperactivity disorder, predominantly inattentive type: Secondary | ICD-10-CM | POA: Diagnosis not present

## 2020-10-03 ENCOUNTER — Encounter: Payer: Self-pay | Admitting: *Deleted

## 2020-10-04 ENCOUNTER — Encounter (INDEPENDENT_AMBULATORY_CARE_PROVIDER_SITE_OTHER): Payer: Self-pay

## 2020-10-04 ENCOUNTER — Other Ambulatory Visit: Payer: Medicaid Other | Admitting: Advanced Practice Midwife

## 2020-10-05 DIAGNOSIS — F900 Attention-deficit hyperactivity disorder, predominantly inattentive type: Secondary | ICD-10-CM | POA: Diagnosis not present

## 2020-10-05 DIAGNOSIS — F1124 Opioid dependence with opioid-induced mood disorder: Secondary | ICD-10-CM | POA: Diagnosis not present

## 2020-10-05 DIAGNOSIS — F411 Generalized anxiety disorder: Secondary | ICD-10-CM | POA: Diagnosis not present

## 2020-10-19 DIAGNOSIS — F411 Generalized anxiety disorder: Secondary | ICD-10-CM | POA: Diagnosis not present

## 2020-10-19 DIAGNOSIS — F900 Attention-deficit hyperactivity disorder, predominantly inattentive type: Secondary | ICD-10-CM | POA: Diagnosis not present

## 2020-10-19 DIAGNOSIS — F1124 Opioid dependence with opioid-induced mood disorder: Secondary | ICD-10-CM | POA: Diagnosis not present

## 2020-11-02 DIAGNOSIS — F1124 Opioid dependence with opioid-induced mood disorder: Secondary | ICD-10-CM | POA: Diagnosis not present

## 2020-11-16 DIAGNOSIS — F900 Attention-deficit hyperactivity disorder, predominantly inattentive type: Secondary | ICD-10-CM | POA: Diagnosis not present

## 2020-11-16 DIAGNOSIS — F1124 Opioid dependence with opioid-induced mood disorder: Secondary | ICD-10-CM | POA: Diagnosis not present

## 2020-11-16 DIAGNOSIS — F411 Generalized anxiety disorder: Secondary | ICD-10-CM | POA: Diagnosis not present

## 2020-11-30 DIAGNOSIS — F1124 Opioid dependence with opioid-induced mood disorder: Secondary | ICD-10-CM | POA: Diagnosis not present

## 2020-11-30 DIAGNOSIS — Z79891 Long term (current) use of opiate analgesic: Secondary | ICD-10-CM | POA: Diagnosis not present

## 2020-12-14 DIAGNOSIS — Z79891 Long term (current) use of opiate analgesic: Secondary | ICD-10-CM | POA: Diagnosis not present

## 2020-12-14 DIAGNOSIS — F1124 Opioid dependence with opioid-induced mood disorder: Secondary | ICD-10-CM | POA: Diagnosis not present

## 2020-12-28 DIAGNOSIS — Z79891 Long term (current) use of opiate analgesic: Secondary | ICD-10-CM | POA: Diagnosis not present

## 2020-12-28 DIAGNOSIS — F1124 Opioid dependence with opioid-induced mood disorder: Secondary | ICD-10-CM | POA: Diagnosis not present

## 2021-01-16 DIAGNOSIS — F329 Major depressive disorder, single episode, unspecified: Secondary | ICD-10-CM | POA: Diagnosis not present

## 2021-01-18 DIAGNOSIS — Z79891 Long term (current) use of opiate analgesic: Secondary | ICD-10-CM | POA: Diagnosis not present

## 2021-01-18 DIAGNOSIS — F1124 Opioid dependence with opioid-induced mood disorder: Secondary | ICD-10-CM | POA: Diagnosis not present

## 2021-01-23 DIAGNOSIS — F419 Anxiety disorder, unspecified: Secondary | ICD-10-CM | POA: Diagnosis not present

## 2021-01-25 DIAGNOSIS — F1124 Opioid dependence with opioid-induced mood disorder: Secondary | ICD-10-CM | POA: Diagnosis not present

## 2021-01-25 DIAGNOSIS — Z79891 Long term (current) use of opiate analgesic: Secondary | ICD-10-CM | POA: Diagnosis not present

## 2021-01-30 DIAGNOSIS — F419 Anxiety disorder, unspecified: Secondary | ICD-10-CM | POA: Diagnosis not present

## 2021-02-01 DIAGNOSIS — F1124 Opioid dependence with opioid-induced mood disorder: Secondary | ICD-10-CM | POA: Diagnosis not present

## 2021-02-01 DIAGNOSIS — Z79899 Other long term (current) drug therapy: Secondary | ICD-10-CM | POA: Diagnosis not present

## 2021-02-01 DIAGNOSIS — Z79891 Long term (current) use of opiate analgesic: Secondary | ICD-10-CM | POA: Diagnosis not present

## 2021-02-06 DIAGNOSIS — F419 Anxiety disorder, unspecified: Secondary | ICD-10-CM | POA: Diagnosis not present

## 2021-02-08 DIAGNOSIS — Z79891 Long term (current) use of opiate analgesic: Secondary | ICD-10-CM | POA: Diagnosis not present

## 2021-02-08 DIAGNOSIS — F411 Generalized anxiety disorder: Secondary | ICD-10-CM | POA: Diagnosis not present

## 2021-02-08 DIAGNOSIS — F1124 Opioid dependence with opioid-induced mood disorder: Secondary | ICD-10-CM | POA: Diagnosis not present

## 2021-02-08 DIAGNOSIS — Z79899 Other long term (current) drug therapy: Secondary | ICD-10-CM | POA: Diagnosis not present

## 2021-02-08 DIAGNOSIS — F900 Attention-deficit hyperactivity disorder, predominantly inattentive type: Secondary | ICD-10-CM | POA: Diagnosis not present

## 2021-02-20 DIAGNOSIS — F329 Major depressive disorder, single episode, unspecified: Secondary | ICD-10-CM | POA: Diagnosis not present

## 2021-02-22 DIAGNOSIS — F1124 Opioid dependence with opioid-induced mood disorder: Secondary | ICD-10-CM | POA: Diagnosis not present

## 2021-02-22 DIAGNOSIS — Z79899 Other long term (current) drug therapy: Secondary | ICD-10-CM | POA: Diagnosis not present

## 2021-02-27 DIAGNOSIS — F419 Anxiety disorder, unspecified: Secondary | ICD-10-CM | POA: Diagnosis not present

## 2021-03-08 DIAGNOSIS — Z79899 Other long term (current) drug therapy: Secondary | ICD-10-CM | POA: Diagnosis not present

## 2021-03-08 DIAGNOSIS — F1124 Opioid dependence with opioid-induced mood disorder: Secondary | ICD-10-CM | POA: Diagnosis not present

## 2021-03-13 DIAGNOSIS — F419 Anxiety disorder, unspecified: Secondary | ICD-10-CM | POA: Diagnosis not present

## 2021-03-20 DIAGNOSIS — F419 Anxiety disorder, unspecified: Secondary | ICD-10-CM | POA: Diagnosis not present

## 2021-03-22 DIAGNOSIS — Z79899 Other long term (current) drug therapy: Secondary | ICD-10-CM | POA: Diagnosis not present

## 2021-03-22 DIAGNOSIS — F1124 Opioid dependence with opioid-induced mood disorder: Secondary | ICD-10-CM | POA: Diagnosis not present

## 2021-03-29 DIAGNOSIS — F1124 Opioid dependence with opioid-induced mood disorder: Secondary | ICD-10-CM | POA: Diagnosis not present

## 2021-03-29 DIAGNOSIS — Z79899 Other long term (current) drug therapy: Secondary | ICD-10-CM | POA: Diagnosis not present

## 2021-04-10 DIAGNOSIS — F419 Anxiety disorder, unspecified: Secondary | ICD-10-CM | POA: Diagnosis not present

## 2021-04-12 DIAGNOSIS — Z79899 Other long term (current) drug therapy: Secondary | ICD-10-CM | POA: Diagnosis not present

## 2021-04-12 DIAGNOSIS — F1124 Opioid dependence with opioid-induced mood disorder: Secondary | ICD-10-CM | POA: Diagnosis not present

## 2021-04-24 DIAGNOSIS — F419 Anxiety disorder, unspecified: Secondary | ICD-10-CM | POA: Diagnosis not present

## 2021-04-26 DIAGNOSIS — F1124 Opioid dependence with opioid-induced mood disorder: Secondary | ICD-10-CM | POA: Diagnosis not present

## 2021-04-26 DIAGNOSIS — Z79899 Other long term (current) drug therapy: Secondary | ICD-10-CM | POA: Diagnosis not present

## 2021-05-10 DIAGNOSIS — F1124 Opioid dependence with opioid-induced mood disorder: Secondary | ICD-10-CM | POA: Diagnosis not present

## 2021-05-10 DIAGNOSIS — Z79899 Other long term (current) drug therapy: Secondary | ICD-10-CM | POA: Diagnosis not present

## 2021-05-15 DIAGNOSIS — F419 Anxiety disorder, unspecified: Secondary | ICD-10-CM | POA: Diagnosis not present

## 2021-05-17 DIAGNOSIS — F1124 Opioid dependence with opioid-induced mood disorder: Secondary | ICD-10-CM | POA: Diagnosis not present

## 2021-05-17 DIAGNOSIS — Z79899 Other long term (current) drug therapy: Secondary | ICD-10-CM | POA: Diagnosis not present

## 2021-05-24 DIAGNOSIS — Z79899 Other long term (current) drug therapy: Secondary | ICD-10-CM | POA: Diagnosis not present

## 2021-05-24 DIAGNOSIS — F1124 Opioid dependence with opioid-induced mood disorder: Secondary | ICD-10-CM | POA: Diagnosis not present

## 2021-05-31 DIAGNOSIS — Z79891 Long term (current) use of opiate analgesic: Secondary | ICD-10-CM | POA: Diagnosis not present

## 2021-05-31 DIAGNOSIS — Z79899 Other long term (current) drug therapy: Secondary | ICD-10-CM | POA: Diagnosis not present

## 2021-05-31 DIAGNOSIS — F1124 Opioid dependence with opioid-induced mood disorder: Secondary | ICD-10-CM | POA: Diagnosis not present

## 2021-06-14 DIAGNOSIS — F1124 Opioid dependence with opioid-induced mood disorder: Secondary | ICD-10-CM | POA: Diagnosis not present

## 2021-06-14 DIAGNOSIS — Z79891 Long term (current) use of opiate analgesic: Secondary | ICD-10-CM | POA: Diagnosis not present

## 2021-06-14 DIAGNOSIS — Z79899 Other long term (current) drug therapy: Secondary | ICD-10-CM | POA: Diagnosis not present

## 2021-07-12 DIAGNOSIS — Z79899 Other long term (current) drug therapy: Secondary | ICD-10-CM | POA: Diagnosis not present

## 2021-07-12 DIAGNOSIS — F1124 Opioid dependence with opioid-induced mood disorder: Secondary | ICD-10-CM | POA: Diagnosis not present

## 2021-07-30 DIAGNOSIS — F1124 Opioid dependence with opioid-induced mood disorder: Secondary | ICD-10-CM | POA: Diagnosis not present

## 2021-07-30 DIAGNOSIS — Z79899 Other long term (current) drug therapy: Secondary | ICD-10-CM | POA: Diagnosis not present

## 2021-07-30 DIAGNOSIS — Z79891 Long term (current) use of opiate analgesic: Secondary | ICD-10-CM | POA: Diagnosis not present

## 2021-10-23 ENCOUNTER — Other Ambulatory Visit (HOSPITAL_COMMUNITY)
Admission: RE | Admit: 2021-10-23 | Discharge: 2021-10-23 | Disposition: A | Discharge status: Home / Self Care | Payer: Medicaid Other | Source: Ambulatory Visit | Attending: Obstetrics & Gynecology | Admitting: Obstetrics & Gynecology

## 2021-10-23 ENCOUNTER — Ambulatory Visit (INDEPENDENT_AMBULATORY_CARE_PROVIDER_SITE_OTHER): Payer: Medicaid Other | Admitting: Obstetrics & Gynecology

## 2021-10-23 ENCOUNTER — Encounter: Payer: Self-pay | Admitting: Obstetrics & Gynecology

## 2021-10-23 ENCOUNTER — Other Ambulatory Visit: Payer: Self-pay

## 2021-10-23 VITALS — BP 116/72 | HR 85 | Ht 71.0 in | Wt 186.4 lb

## 2021-10-23 DIAGNOSIS — Z113 Encounter for screening for infections with a predominantly sexual mode of transmission: Secondary | ICD-10-CM | POA: Diagnosis present

## 2021-10-23 DIAGNOSIS — Z01419 Encounter for gynecological examination (general) (routine) without abnormal findings: Secondary | ICD-10-CM | POA: Diagnosis not present

## 2021-10-23 NOTE — Progress Notes (Signed)
? ?WELL-WOMAN EXAMINATION ?Patient name: Alexis [name] MRN [id number]  Date of birth: [birth year] ?Chief Complaint:   ?Annual Exam ? ?History of Present Illness:   ?Alexis [name] is a 31 y.o. [phone] PH female being seen today for a routine well-woman exam.  ? ?Sexually active with one partner- partner had episode of bleeding during intercourse.  After, she noted passage of large clot that was likely from her partner.  She denies any further vaginal bleeding.  Maybe slight discharge, no odor or itching.  Also for about a week - lower pelvic pain. Comes/goes typically in the am and late at night.  Mostly achy pain, 8/10.  Improves with movement.  Minimal improvement with Goodies. During the day, she doesn't really have it- mostly in the morning and end of day.  ? ?In the process of transitioning PCP.  Pt medically history included SUD, recently had relapse with Fentanyl- in the process of trying to re-establish care. ? ?Patient's last menstrual period was 06/23/2019. ? ?The current method of family planning is  hysterectomy .  ? ?Last pap 2020.  ?Last mammogram: n/a. ?Last colonoscopy: n/a ? ? ?  10/23/2021  ?  9:55 AM 01/11/2018  ? 10:14 AM  ?Depression screen PHQ 2/9  ?Decreased Interest 1 1  ?Down, Depressed, Hopeless 1 1  ?PHQ - 2 Score 2 2  ?Altered sleeping 1 0  ?Tired, decreased energy 2 1  ?Change in appetite 2 1  ?Feeling bad or failure about yourself  1 1  ?Trouble concentrating 1 3  ?Moving slowly or fidgety/restless 1 0  ?Suicidal thoughts 0 1  ?PHQ-9 Score 10 9  ?Difficult doing work/chores  Somewhat difficult  ? ? ? ? ?Review of Systems:   ?Pertinent items are noted in HPI ?Denies any headaches, blurred vision, fatigue, shortness of breath, chest pain, bowel movements, urination, or intercourse unless otherwise stated above. ? ?Pertinent History Reviewed:  ?Reviewed past medical,surgical, social and family history.  ?Reviewed problem list, medications and allergies. ?Physical Assessment:   ? ?Vitals:  ? 10/23/21 0949  ?BP: 116/72  ?Pulse: 85  ?Weight: 186 lb 6.4 oz (84.6 kg)  ?Height: 5\' 11"  (1.803 m)  ?Body mass index is 26 kg/m?. ?  ?     Physical Examination:  ? General appearance - well appearing, and in no distress ? Mental status - alert, oriented to person, place, and time ? Psych:  She has a normal mood and affect ? Skin - warm and dry, normal color, no suspicious lesions noted ? Chest - effort normal, all lung fields clear to auscultation bilaterally ? Heart - normal rate and regular rhythm ? Neck:  midline trachea, no thyromegaly or nodules ? Breasts - breasts appear normal, no suspicious masses, no skin or nipple changes or  axillary nodes ? Abdomen - soft, nontender, nondistended, no masses or organomegaly, no reproducible pain ? Pelvic - VULVA: normal appearing vulva with no masses, tenderness or lesions  VAGINA: normal appearing vagina with normal color and discharge, no lesions.  Uterus and cervix surgically absent ADNEXA: No adnexal masses or tenderness noted. ? Extremities:  No swelling or varicosities noted, no calf tenderness bilaterally ? ?Chaperone:   ? ? ?Assessment & Plan:  ?1) Well-Woman Exam ?-pap not indicated due to hyst ?-mammogram not indicated ? ?2) STI testing ?-both vaginal and blood work to be completed ? ?3) Pelvic pain ?-suspect component of anxiety ?-if pain continues s/p results of today's testing may consider pelvic Clint Bolder  in the future ? ?Orders Placed This Encounter  ?Procedures  ? HIV Antibody (routine testing w rflx)  ? Hepatitis C antibody  ? Hepatitis B surface antigen  ? RPR  ? ? ?Meds: No orders of the defined types were placed in this encounter. ? ? ?Follow-up: Return in about 2 years (around 10/24/2023) for Annual. ? ? ?Myna Hidalgo, DO ?Attending Obstetrician & Gynecologist, Faculty Practice ?[facility] for [employer] ? ? ?

## 2021-10-24 ENCOUNTER — Other Ambulatory Visit: Payer: Self-pay | Admitting: Obstetrics & Gynecology

## 2021-10-24 DIAGNOSIS — B9689 Other specified bacterial agents as the cause of diseases classified elsewhere: Secondary | ICD-10-CM

## 2021-10-24 LAB — CERVICOVAGINAL ANCILLARY ONLY
Bacterial Vaginitis (gardnerella): POSITIVE — AB
Candida Glabrata: NEGATIVE
Candida Vaginitis: NEGATIVE
Chlamydia: NEGATIVE
Comment: NEGATIVE
Comment: NEGATIVE
Comment: NEGATIVE
Comment: NEGATIVE
Comment: NEGATIVE
Comment: NORMAL
Neisseria Gonorrhea: NEGATIVE
Trichomonas: POSITIVE — AB

## 2021-10-24 MED ORDER — METRONIDAZOLE 500 MG PO TABS: 500.0000 mg | ORAL_TABLET | Freq: Two times a day (BID) | ORAL | 1 refills | Status: AC

## 2021-10-24 NOTE — Progress Notes (Signed)
Rx for Metronidazole due to +trich and BV ?

## 2021-10-24 NOTE — Progress Notes (Signed)
CCDR faxed °

## 2023-06-16 ENCOUNTER — Emergency Department (HOSPITAL_COMMUNITY)
Admission: EM | Admit: 2023-06-16 | Discharge: 2023-06-16 | Disposition: A | Discharge status: Home / Self Care | Payer: 59 | Attending: Student | Admitting: Student

## 2023-06-16 ENCOUNTER — Other Ambulatory Visit: Payer: Self-pay

## 2023-06-16 DIAGNOSIS — R4182 Altered mental status, unspecified: Secondary | ICD-10-CM | POA: Insufficient documentation

## 2023-06-16 DIAGNOSIS — F17210 Nicotine dependence, cigarettes, uncomplicated: Secondary | ICD-10-CM | POA: Diagnosis not present

## 2023-06-16 DIAGNOSIS — R410 Disorientation, unspecified: Secondary | ICD-10-CM | POA: Insufficient documentation

## 2023-06-16 DIAGNOSIS — F1910 Other psychoactive substance abuse, uncomplicated: Secondary | ICD-10-CM | POA: Diagnosis not present

## 2023-06-16 DIAGNOSIS — R7401 Elevation of levels of liver transaminase levels: Secondary | ICD-10-CM | POA: Diagnosis not present

## 2023-06-16 DIAGNOSIS — T50901A Poisoning by unspecified drugs, medicaments and biological substances, accidental (unintentional), initial encounter: Secondary | ICD-10-CM | POA: Diagnosis not present

## 2023-06-16 DIAGNOSIS — T40601A Poisoning by unspecified narcotics, accidental (unintentional), initial encounter: Secondary | ICD-10-CM | POA: Diagnosis not present

## 2023-06-16 DIAGNOSIS — E876 Hypokalemia: Secondary | ICD-10-CM | POA: Diagnosis not present

## 2023-06-16 DIAGNOSIS — R9431 Abnormal electrocardiogram [ECG] [EKG]: Secondary | ICD-10-CM | POA: Diagnosis not present

## 2023-06-16 LAB — CBC WITH DIFFERENTIAL/PLATELET
Abs Immature Granulocytes: 0.01 10*3/uL (ref 0.00–0.07)
Basophils Absolute: 0 10*3/uL (ref 0.0–0.1)
Basophils Relative: 1 %
Eosinophils Absolute: 0.2 10*3/uL (ref 0.0–0.5)
Eosinophils Relative: 3 %
HCT: 40.1 % (ref 36.0–46.0)
Hemoglobin: 13.3 g/dL (ref 12.0–15.0)
Immature Granulocytes: 0 %
Lymphocytes Relative: 48 %
Lymphs Abs: 2.8 10*3/uL (ref 0.7–4.0)
MCH: 31.2 pg (ref 26.0–34.0)
MCHC: 33.2 g/dL (ref 30.0–36.0)
MCV: 94.1 fL (ref 80.0–100.0)
Monocytes Absolute: 0.5 10*3/uL (ref 0.1–1.0)
Monocytes Relative: 9 %
Neutro Abs: 2.3 10*3/uL (ref 1.7–7.7)
Neutrophils Relative %: 39 %
Platelets: 268 10*3/uL (ref 150–400)
RBC: 4.26 MIL/uL (ref 3.87–5.11)
RDW: 13.5 % (ref 11.5–15.5)
WBC: 5.9 10*3/uL (ref 4.0–10.5)
nRBC: 0 % (ref 0.0–0.2)

## 2023-06-16 LAB — COMPREHENSIVE METABOLIC PANEL
ALT: 777 U/L — ABNORMAL HIGH (ref 0–44)
AST: 444 U/L — ABNORMAL HIGH (ref 15–41)
Albumin: 3.9 g/dL (ref 3.5–5.0)
Alkaline Phosphatase: 93 U/L (ref 38–126)
Anion gap: 10 (ref 5–15)
BUN: 12 mg/dL (ref 6–20)
CO2: 28 mmol/L (ref 22–32)
Calcium: 9.1 mg/dL (ref 8.9–10.3)
Chloride: 102 mmol/L (ref 98–111)
Creatinine, Ser: 0.7 mg/dL (ref 0.44–1.00)
GFR, Estimated: >60 mL/min (ref >60)
Glucose, Bld: 120 mg/dL — ABNORMAL HIGH (ref 70–99)
Potassium: 2.7 mmol/L — CL (ref 3.5–5.1)
Sodium: 140 mmol/L (ref 135–145)
Total Bilirubin: 1 mg/dL (ref <1.2)
Total Protein: 7 g/dL (ref 6.5–8.1)

## 2023-06-16 LAB — CBG MONITORING, ED
Glucose-Capillary: 66 mg/dL — ABNORMAL LOW (ref 70–99)
Glucose-Capillary: 128 mg/dL — ABNORMAL HIGH (ref 70–99)
Glucose-Capillary: 85 mg/dL (ref 70–99)

## 2023-06-16 LAB — HEPATITIS PANEL, ACUTE
HCV Ab: REACTIVE — AB
Hep A IgM: NONREACTIVE
Hep B C IgM: NONREACTIVE
Hepatitis B Surface Ag: NONREACTIVE

## 2023-06-16 MED ORDER — POTASSIUM CHLORIDE CRYS ER 20 MEQ PO TBCR
40.0000 meq | EXTENDED_RELEASE_TABLET | Freq: Once | ORAL | Status: AC
  Administered 2023-06-16: 40 meq via ORAL
  Filled 2023-06-16: qty 2

## 2023-06-16 MED ORDER — DEXTROSE 50 % IV SOLN
1.0000 | Freq: Once | INTRAVENOUS | Status: AC
  Administered 2023-06-16: 50 mL via INTRAVENOUS
  Filled 2023-06-16: qty 50

## 2023-06-16 MED ORDER — MAGNESIUM SULFATE IN D5W 1-5 GM/100ML-% IV SOLN
1.0000 g | Freq: Once | INTRAVENOUS | Status: AC
  Administered 2023-06-16: 1 g via INTRAVENOUS
  Filled 2023-06-16: qty 100

## 2023-06-16 MED ORDER — NALOXONE HCL 0.4 MG/ML IJ SOLN
0.4000 mg | Freq: Once | INTRAMUSCULAR | Status: DC
  Filled 2023-06-16: qty 1

## 2023-06-16 MED ORDER — POTASSIUM CHLORIDE 10 MEQ/100ML IV SOLN
10.0000 meq | INTRAVENOUS | Status: AC
  Administered 2023-06-16 (×2): 10 meq via INTRAVENOUS
  Filled 2023-06-16 (×2): qty 100

## 2023-06-16 NOTE — ED Provider Notes (Signed)
  Provider Note MRN:  [id number]  Arrival date & time: 06/16/23    ED Course and Medical Decision Making  Assumed care from [facility] at shift change.  See note from prior team for complete details, in brief:  32 yo female Got narcan in jail Needs more time to metabolize  Hepatitis panel pending   Plan per prior physician metabolize  Clinical Course as of 06/16/23 1352  Tue Jun 16, 2023  8657 Pt remains quite sleepy  [SG]  1051 Re-assessed the pt, she is somewhat more awake but unable to hold her head up, falls back to sleep without ongoing provocation. She denies any complaints, was able to drink a few sips of water. Will need more time to metabolize [SG]  1215 Still somewhat sleepy on exam but slowly getting more alert [SG]    Clinical Course User Index [SG] Sloan Leiter, DO   Patient feeling better on recheck, she is able to sit upright in bed without assistance, tolerant p.o. difficulty, no nausea or vomiting, no hypoxia.  Tolerant p.o. without difficulty.  Discharge in custody of police  Counseled patient for approximately 3 minutes regarding smoking cessation. Discussed risks of smoking and how they applied and affected their visit here today. Patient not ready to quit at this time, however will follow up with their primary doctor when they are.   CPT code: 84696: intermediate counseling for smoking cessation     The patient improved significantly and was discharged in stable condition. Detailed discussions were had with the patient regarding current findings, and need for close f/u with PCP or on call doctor. The patient has been instructed to return immediately if the symptoms worsen in any way for re-evaluation. Patient verbalized understanding and is in agreement with current care plan. All questions answered prior to discharge.       Procedures  Final Clinical Impressions(s) / ED Diagnoses     ICD-10-CM   1. Polysubstance abuse (HCC)  F19.10     2. Accidental  drug overdose, initial encounter  T50.901A     3. Hypokalemia  E87.6       ED Discharge Orders     None         Discharge Instructions      It was a pleasure caring for you today in the emergency department.  Please refrain from illicit drug use in the future  Please consider smoking cessation  Please follow-up with PCP regarding hepatitis panel  Please return to the emergency department for any worsening or worrisome symptoms.          Sloan Leiter, DO 06/16/23 1352

## 2023-06-16 NOTE — ED Notes (Addendum)
Pt asked to provide urine sample  

## 2023-06-16 NOTE — ED Triage Notes (Signed)
Pt bib [facility] from [employer]. Pt was under arrest for drug possession of Heroin, Fentanyl, and cocaine. Per officer pt went unresponsive and was given 4 mg of narcan. Pt is alert but uncooperative in triage.

## 2023-06-16 NOTE — ED Notes (Signed)
Pt is sleeping hard, unable to wake up to obtain urine sample.

## 2023-06-16 NOTE — ED Notes (Signed)
Patient denies pain and is resting comfortably.  

## 2023-06-16 NOTE — Discharge Instructions (Addendum)
It was a pleasure caring for you today in the emergency department.  Please refrain from illicit drug use in the future  Please consider smoking cessation  Please follow-up with PCP regarding hepatitis panel  Please return to the emergency department for any worsening or worrisome symptoms.

## 2023-06-16 NOTE — ED Provider Notes (Signed)
EMERGENCY DEPARTMENT AT [facility] Provider Note  CSN: [id number] Arrival date & time: 06/16/23 4782  Chief Complaint(s) Drug Overdose  HPI Alexis [name] is a 32 y.o. female with PMH polysubstance abuse, anxiety who presents emergency department for evaluation of a drug overdose.  History obtained from police who states that they initially received a call for somebody trying to steal a pressure washer.  Multiple illicit substances found on the patient's person including crack cocaine, heroin, fentanyl.  Patient was being booked in jail when she went persistently somnolent and required multiple doses of Narcan to wake the patient.  She arrives to the emergency room and altered but will arouse to noxious stimuli.  Additional history unable to be obtained secondary to patient's current intoxication.   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 Authorizing Provider  ALPRAZolam Prudy Feeler) 0.5 MG tablet Take 0.5 mg by mouth 3 (three) times daily as needed for anxiety.     [provider]  buprenorphine (SUBUTEX) 8 MG SUBL SL tablet Place 8 mg under the tongue 3 (three) times daily.  Patient not taking: Reported on 10/23/2021    [provider]  gabapentin (NEURONTIN) 400 MG capsule Take 400 mg by mouth 3 (three) times daily.     [provider]  lansoprazole (PREVACID) 30 MG capsule Take 30 mg by mouth daily as needed (heartburn).  Patient not taking: Reported on 10/23/2021    [provider]  linaclotide Karlene Einstein) 290 MCG CAPS capsule Take 1 capsule (290 mcg total) by mouth daily before breakfast. Patient not taking: Reported on 10/23/2021 02/22/19   Tilda Burrow, MD  lisdexamfetamine (VYVANSE) 70 MG capsule Take 70 mg by mouth daily as needed (when working).     [provider]  metroNIDAZOLE (FLAGYL) 500 MG tablet Take 1 tablet (500 mg total) by mouth 2 (two) times daily. 10/24/21   Myna Hidalgo, DO  promethazine (PHENERGAN) 12.5 MG tablet Take by mouth. 05/24/21   [provider]  QUEtiapine (SEROQUEL) 300 MG tablet Take 300 mg by mouth daily as needed (sleep).     [provider]  venlafaxine XR (EFFEXOR-XR) 150 MG 24 hr capsule Take 150 mg by mouth daily. 10/21/21   [provider]  Past Surgical History Past Surgical History:  Procedure Laterality Date   RECTOCELE REPAIR N/A 01/31/2020   Procedure: POSTERIOR REPAIR (RECTOCELE);  Surgeon: Tilda Burrow, MD;  Location: [facility];  Service: Gynecology;  Laterality: N/A;   VAGINAL HYSTERECTOMY N/A 01/31/2020   Procedure: VAGINAL HYSTERECTOMY WITH REMOVAL OF CERVIX AND UTERUS ;  Surgeon: Tilda Burrow, MD;  Location: [facility];  Service: Gynecology;  Laterality: N/A;    WISDOM TOOTH EXTRACTION     Family History Family History  Problem Relation Age of Onset   Depression Paternal Grandmother    Hypertension Maternal Grandmother    Cancer Maternal Grandmother        skin   Aneurysm Maternal Grandmother    Hypertension Mother    Eczema Daughter    Allergic rhinitis Neg Hx    Angioedema Neg Hx    Asthma Neg Hx    Atopy Neg Hx    Immunodeficiency Neg Hx    Urticaria Neg Hx     Social History Social History   Tobacco Use   Smoking status: Every Day    Current packs/day: 0.50    Average packs/day: 0.5 packs/day for 10.0 years (5.0 ttl pk-yrs)    Types: Cigarettes   Smokeless tobacco: Never   Tobacco comments:    1 whole pack will last 1-2 weeks most of the time.   Vaping Use   Vaping status: Former  Substance Use Topics   Alcohol use: Not Currently    Comment: very rare glass of wine.    Drug use: Not Currently    Types: Heroin, Cocaine, "Crack" cocaine   Allergies Naloxone, Tramadol, and Vicodin [hydrocodone-acetaminophen]  Review of Systems Review of Systems  Unable to perform ROS: Mental status change    Physical Exam Vital Signs  I have reviewed the triage vital signs BP (!) 109/93   Pulse 71   Temp (!) 97.3 F (36.3 C) (Axillary)   Resp 15   LMP 06/23/2019   SpO2 93%   Physical Exam Vitals and nursing note reviewed.  Constitutional:      General: She is not in acute distress.    Appearance: She is well-developed. She is ill-appearing.  HENT:     Head: Normocephalic and atraumatic.  Eyes:     Conjunctiva/sclera: Conjunctivae normal.  Cardiovascular:     Rate and Rhythm: Normal rate and regular rhythm.     Heart sounds: No murmur heard. Pulmonary:     Effort: Pulmonary effort is normal. No respiratory distress.     Breath sounds: Normal breath sounds.  Abdominal:     Palpations: Abdomen is soft.     Tenderness: There is no abdominal tenderness.  Musculoskeletal:        General: No swelling.     Cervical back:  Neck supple.  Skin:    General: Skin is warm and dry.     Capillary Refill: Capillary refill takes less than 2 seconds.  Neurological:     Mental Status: She is alert. She is disoriented.  Psychiatric:        Mood and Affect: Mood normal.     ED Results and Treatments Labs (all labs ordered are listed, but only abnormal results are displayed) Labs Reviewed  CBG MONITORING, ED - Abnormal; Notable for the following components:      Result Value   Glucose-Capillary 66 (*)    All other components within normal limits  CBG MONITORING, ED - Abnormal; Notable for the following components:  Glucose-Capillary 128 (*)    All other components within normal limits  CBC WITH DIFFERENTIAL/PLATELET  RAPID URINE DRUG SCREEN, HOSP PERFORMED  COMPREHENSIVE METABOLIC PANEL                                                                                                                          Radiology No results found.  Pertinent labs & imaging results that were available during my care of the patient were reviewed by me and considered in my medical decision making (see MDM for details).  Medications Ordered in ED Medications  naloxone (NARCAN) injection 0.4 mg (0 mg Intravenous Hold 06/16/23 0503)  dextrose 50 % solution 50 mL (50 mLs Intravenous Given 06/16/23 0450)                                                                                                                                     Procedures Procedures  (including critical care time)  Medical Decision Making / ED Course   This patient presents to the ED for concern of drug overdose, altered mental status, this involves an extensive number of treatment options, and is a complaint that carries with it a high risk of complications and morbidity.  The differential diagnosis includes polysubstance use, Polysubstance withdrawal, hypoglycemia, electrolyte abnormality, intracranial bleed  MDM: Patient seen emergency room for  evaluation of altered mental status in the setting of a suspected drug overdose.  Physical exam reveals an altered disoriented patients who will arouse to noxious stimuli.  Laboratory evaluation with a potassium of 2.7 and IV potassium and IV magnesium ordered for the patient.  ECG nonischemic.  Patient does have a significant transaminitis with AST 444, ALT 777 and an acute hepatitis panel was sent.  Patient did have initial hypoglycemia and received an amp of D50.  At time of signout, patient pending metabolization of underlying substances and reevaluation by oncoming provider.  Please see provider signout for continuation of workup.  Anticipate discharge   Additional history obtained: -Additional history obtained from police -External records from outside source obtained and reviewed including: Chart review including previous notes, labs, imaging, consultation notes   Lab Tests: -I ordered, reviewed, and interpreted labs.   The pertinent results include:   Labs Reviewed  CBG MONITORING, ED - Abnormal; Notable for the following components:      Result Value   Glucose-Capillary 66 (*)  All other components within normal limits  CBG MONITORING, ED - Abnormal; Notable for the following components:   Glucose-Capillary 128 (*)    All other components within normal limits  CBC WITH DIFFERENTIAL/PLATELET  RAPID URINE DRUG SCREEN, HOSP PERFORMED  COMPREHENSIVE METABOLIC PANEL      EKG   EKG Interpretation Date/Time:  Tuesday June 16 2023 04:28:52 EST Ventricular Rate:  84 PR Interval:  179 QRS Duration:  94 QT Interval:  393 QTC Calculation: 465 R Axis:   87  Text Interpretation: Sinus rhythm Confirmed by Kaytlin Burklow ([id number]) on 06/16/2023 6:12:26 AM           Medicines ordered and prescription drug management: Meds ordered this encounter  Medications   dextrose 50 % solution 50 mL   naloxone (NARCAN) injection 0.4 mg    -I have reviewed the patients home medicines  and have made adjustments as needed  Critical interventions none    Cardiac Monitoring: The patient was maintained on a cardiac monitor.  I personally viewed and interpreted the cardiac monitored which showed an underlying rhythm of: NSR  Social Determinants of Health:  Factors impacting patients care include: Polysubstance use   Reevaluation: After the interventions noted above, I reevaluated the patient and found that they have :improved  Co morbidities that complicate the patient evaluation  Past Medical History:  Diagnosis Date   ADD (attention deficit disorder)    Angio-edema    Anxiety    Chlamydia 01/27/2019   Treated with azithromycin 500 mg 2 po now, 01/27/2019-----------POC_________   Cystocele with uterine descensus    uterine descensus per OB/GYN history- no mention of cystocele, only uterine descensus   Depression    Gonorrhea 01/27/2019   Insomnia    Substance abuse (HCC)    Urticaria       Dispostion: I considered admission for this patient, and disposition pending metabolization of underlying substances and reevaluation by oncoming provider.  Please see provider signout for continuation of workup.     Final Clinical Impression(s) / ED Diagnoses Final diagnoses:  None     @PCDICTATION @    Glendora Score, MD 06/16/23 1910

## 2023-12-12 DIAGNOSIS — F1320 Sedative, hypnotic or anxiolytic dependence, uncomplicated: Secondary | ICD-10-CM | POA: Diagnosis not present

## 2023-12-12 DIAGNOSIS — F1120 Opioid dependence, uncomplicated: Secondary | ICD-10-CM | POA: Diagnosis not present

## 2023-12-12 DIAGNOSIS — F17210 Nicotine dependence, cigarettes, uncomplicated: Secondary | ICD-10-CM | POA: Diagnosis not present

## 2023-12-19 DIAGNOSIS — F1320 Sedative, hypnotic or anxiolytic dependence, uncomplicated: Secondary | ICD-10-CM | POA: Diagnosis not present

## 2023-12-19 DIAGNOSIS — F17210 Nicotine dependence, cigarettes, uncomplicated: Secondary | ICD-10-CM | POA: Diagnosis not present

## 2023-12-19 DIAGNOSIS — F1120 Opioid dependence, uncomplicated: Secondary | ICD-10-CM | POA: Diagnosis not present

## 2023-12-26 DIAGNOSIS — F1120 Opioid dependence, uncomplicated: Secondary | ICD-10-CM | POA: Diagnosis not present

## 2023-12-26 DIAGNOSIS — F17210 Nicotine dependence, cigarettes, uncomplicated: Secondary | ICD-10-CM | POA: Diagnosis not present

## 2023-12-26 DIAGNOSIS — F1320 Sedative, hypnotic or anxiolytic dependence, uncomplicated: Secondary | ICD-10-CM | POA: Diagnosis not present

## 2024-01-02 DIAGNOSIS — F1120 Opioid dependence, uncomplicated: Secondary | ICD-10-CM | POA: Diagnosis not present

## 2024-01-02 DIAGNOSIS — F1320 Sedative, hypnotic or anxiolytic dependence, uncomplicated: Secondary | ICD-10-CM | POA: Diagnosis not present

## 2024-01-02 DIAGNOSIS — F17210 Nicotine dependence, cigarettes, uncomplicated: Secondary | ICD-10-CM | POA: Diagnosis not present

## 2024-01-09 DIAGNOSIS — F1320 Sedative, hypnotic or anxiolytic dependence, uncomplicated: Secondary | ICD-10-CM | POA: Diagnosis not present

## 2024-01-09 DIAGNOSIS — F1120 Opioid dependence, uncomplicated: Secondary | ICD-10-CM | POA: Diagnosis not present

## 2024-01-09 DIAGNOSIS — F17210 Nicotine dependence, cigarettes, uncomplicated: Secondary | ICD-10-CM | POA: Diagnosis not present

## 2024-01-16 DIAGNOSIS — F1120 Opioid dependence, uncomplicated: Secondary | ICD-10-CM | POA: Diagnosis not present

## 2024-01-16 DIAGNOSIS — F17210 Nicotine dependence, cigarettes, uncomplicated: Secondary | ICD-10-CM | POA: Diagnosis not present

## 2024-01-16 DIAGNOSIS — F1320 Sedative, hypnotic or anxiolytic dependence, uncomplicated: Secondary | ICD-10-CM | POA: Diagnosis not present

## 2024-02-20 DIAGNOSIS — F1320 Sedative, hypnotic or anxiolytic dependence, uncomplicated: Secondary | ICD-10-CM | POA: Diagnosis not present

## 2024-02-20 DIAGNOSIS — F1120 Opioid dependence, uncomplicated: Secondary | ICD-10-CM | POA: Diagnosis not present

## 2024-02-20 DIAGNOSIS — F17210 Nicotine dependence, cigarettes, uncomplicated: Secondary | ICD-10-CM | POA: Diagnosis not present

## 2024-02-20 DIAGNOSIS — B182 Chronic viral hepatitis C: Secondary | ICD-10-CM | POA: Diagnosis not present

## 2024-03-05 DIAGNOSIS — F1120 Opioid dependence, uncomplicated: Secondary | ICD-10-CM | POA: Diagnosis not present

## 2024-03-05 DIAGNOSIS — F1320 Sedative, hypnotic or anxiolytic dependence, uncomplicated: Secondary | ICD-10-CM | POA: Diagnosis not present

## 2024-03-05 DIAGNOSIS — B182 Chronic viral hepatitis C: Secondary | ICD-10-CM | POA: Diagnosis not present

## 2024-03-05 DIAGNOSIS — F17210 Nicotine dependence, cigarettes, uncomplicated: Secondary | ICD-10-CM | POA: Diagnosis not present

## 2024-03-12 DIAGNOSIS — F1320 Sedative, hypnotic or anxiolytic dependence, uncomplicated: Secondary | ICD-10-CM | POA: Diagnosis not present

## 2024-03-12 DIAGNOSIS — F17210 Nicotine dependence, cigarettes, uncomplicated: Secondary | ICD-10-CM | POA: Diagnosis not present

## 2024-03-12 DIAGNOSIS — F1120 Opioid dependence, uncomplicated: Secondary | ICD-10-CM | POA: Diagnosis not present

## 2024-03-12 DIAGNOSIS — B182 Chronic viral hepatitis C: Secondary | ICD-10-CM | POA: Diagnosis not present

## 2024-03-19 DIAGNOSIS — B182 Chronic viral hepatitis C: Secondary | ICD-10-CM | POA: Diagnosis not present

## 2024-03-19 DIAGNOSIS — F17210 Nicotine dependence, cigarettes, uncomplicated: Secondary | ICD-10-CM | POA: Diagnosis not present

## 2024-03-19 DIAGNOSIS — F1320 Sedative, hypnotic or anxiolytic dependence, uncomplicated: Secondary | ICD-10-CM | POA: Diagnosis not present

## 2024-03-19 DIAGNOSIS — F1120 Opioid dependence, uncomplicated: Secondary | ICD-10-CM | POA: Diagnosis not present

## 2024-03-26 DIAGNOSIS — F17210 Nicotine dependence, cigarettes, uncomplicated: Secondary | ICD-10-CM | POA: Diagnosis not present

## 2024-03-26 DIAGNOSIS — B182 Chronic viral hepatitis C: Secondary | ICD-10-CM | POA: Diagnosis not present

## 2024-03-26 DIAGNOSIS — F1120 Opioid dependence, uncomplicated: Secondary | ICD-10-CM | POA: Diagnosis not present

## 2024-03-26 DIAGNOSIS — F1320 Sedative, hypnotic or anxiolytic dependence, uncomplicated: Secondary | ICD-10-CM | POA: Diagnosis not present

## 2024-04-02 DIAGNOSIS — F1120 Opioid dependence, uncomplicated: Secondary | ICD-10-CM | POA: Diagnosis not present

## 2024-04-02 DIAGNOSIS — F17210 Nicotine dependence, cigarettes, uncomplicated: Secondary | ICD-10-CM | POA: Diagnosis not present

## 2024-04-02 DIAGNOSIS — B182 Chronic viral hepatitis C: Secondary | ICD-10-CM | POA: Diagnosis not present

## 2024-04-02 DIAGNOSIS — F1320 Sedative, hypnotic or anxiolytic dependence, uncomplicated: Secondary | ICD-10-CM | POA: Diagnosis not present

## 2024-07-13 ENCOUNTER — Encounter: Payer: Self-pay | Admitting: General Practice
